# Patient Record
Sex: Female | Born: 1980 | Race: Black or African American | Hispanic: No | Marital: Married | Smoking: Never smoker
Health system: Southern US, Community
[De-identification: ages and names within clinical notes are randomized; demographics above are authoritative.]

## PROBLEM LIST (undated history)

## (undated) DIAGNOSIS — F32A Depression, unspecified: Secondary | ICD-10-CM

## (undated) DIAGNOSIS — F329 Major depressive disorder, single episode, unspecified: Secondary | ICD-10-CM

## (undated) DIAGNOSIS — I10 Essential (primary) hypertension: Secondary | ICD-10-CM

## (undated) DIAGNOSIS — F419 Anxiety disorder, unspecified: Secondary | ICD-10-CM

## (undated) DIAGNOSIS — E119 Type 2 diabetes mellitus without complications: Secondary | ICD-10-CM

## (undated) DIAGNOSIS — G4730 Sleep apnea, unspecified: Secondary | ICD-10-CM

## (undated) DIAGNOSIS — K219 Gastro-esophageal reflux disease without esophagitis: Secondary | ICD-10-CM

## (undated) DIAGNOSIS — D649 Anemia, unspecified: Secondary | ICD-10-CM

## (undated) HISTORY — PX: OTHER SURGICAL HISTORY: SHX169

## (undated) HISTORY — PX: ENDOMETRIAL ABLATION: SHX621

---

## 1998-07-30 ENCOUNTER — Emergency Department (HOSPITAL_COMMUNITY): Admission: EM | Admit: 1998-07-30 | Discharge: 1998-07-30 | Payer: Self-pay | Admitting: Emergency Medicine

## 1998-07-30 ENCOUNTER — Inpatient Hospital Stay (HOSPITAL_COMMUNITY): Admission: AD | Admit: 1998-07-30 | Discharge: 1998-07-30 | Payer: Self-pay | Admitting: Obstetrics

## 1998-07-30 ENCOUNTER — Encounter: Payer: Self-pay | Admitting: Emergency Medicine

## 1998-09-19 ENCOUNTER — Emergency Department (HOSPITAL_COMMUNITY): Admission: EM | Admit: 1998-09-19 | Discharge: 1998-09-19 | Payer: Self-pay | Admitting: Emergency Medicine

## 1999-07-01 ENCOUNTER — Encounter: Payer: Self-pay | Admitting: *Deleted

## 1999-07-01 ENCOUNTER — Emergency Department (HOSPITAL_COMMUNITY): Admission: EM | Admit: 1999-07-01 | Discharge: 1999-07-01 | Payer: Self-pay | Admitting: *Deleted

## 1999-11-06 ENCOUNTER — Emergency Department (HOSPITAL_COMMUNITY): Admission: EM | Admit: 1999-11-06 | Discharge: 1999-11-06 | Payer: Self-pay | Admitting: Emergency Medicine

## 2000-02-14 ENCOUNTER — Emergency Department (HOSPITAL_COMMUNITY): Admission: EM | Admit: 2000-02-14 | Discharge: 2000-02-14 | Payer: Self-pay | Admitting: Emergency Medicine

## 2000-04-23 ENCOUNTER — Emergency Department (HOSPITAL_COMMUNITY): Admission: EM | Admit: 2000-04-23 | Discharge: 2000-04-23 | Payer: Self-pay | Admitting: Emergency Medicine

## 2000-04-27 ENCOUNTER — Emergency Department (HOSPITAL_COMMUNITY): Admission: EM | Admit: 2000-04-27 | Discharge: 2000-04-27 | Payer: Self-pay | Admitting: Emergency Medicine

## 2000-07-16 ENCOUNTER — Emergency Department (HOSPITAL_COMMUNITY): Admission: EM | Admit: 2000-07-16 | Discharge: 2000-07-16 | Payer: Self-pay | Admitting: Emergency Medicine

## 2000-07-16 ENCOUNTER — Encounter: Payer: Self-pay | Admitting: Emergency Medicine

## 2000-07-23 ENCOUNTER — Emergency Department (HOSPITAL_COMMUNITY): Admission: EM | Admit: 2000-07-23 | Discharge: 2000-07-24 | Payer: Self-pay | Admitting: Emergency Medicine

## 2001-03-29 ENCOUNTER — Emergency Department (HOSPITAL_COMMUNITY): Admission: EM | Admit: 2001-03-29 | Discharge: 2001-03-29 | Payer: Self-pay | Admitting: Emergency Medicine

## 2001-04-12 ENCOUNTER — Other Ambulatory Visit: Admission: RE | Admit: 2001-04-12 | Discharge: 2001-04-12 | Payer: Self-pay | Admitting: Obstetrics and Gynecology

## 2001-05-09 ENCOUNTER — Emergency Department (HOSPITAL_COMMUNITY): Admission: EM | Admit: 2001-05-09 | Discharge: 2001-05-10 | Payer: Self-pay | Admitting: Emergency Medicine

## 2001-07-13 ENCOUNTER — Emergency Department (HOSPITAL_COMMUNITY): Admission: EM | Admit: 2001-07-13 | Discharge: 2001-07-13 | Payer: Self-pay | Admitting: Emergency Medicine

## 2003-02-14 ENCOUNTER — Encounter: Payer: Self-pay | Admitting: Emergency Medicine

## 2003-02-14 ENCOUNTER — Emergency Department (HOSPITAL_COMMUNITY): Admission: EM | Admit: 2003-02-14 | Discharge: 2003-02-14 | Payer: Self-pay | Admitting: Emergency Medicine

## 2003-02-15 ENCOUNTER — Encounter: Payer: Self-pay | Admitting: Emergency Medicine

## 2003-11-03 ENCOUNTER — Other Ambulatory Visit: Admission: RE | Admit: 2003-11-03 | Discharge: 2003-11-03 | Payer: Self-pay | Admitting: Obstetrics and Gynecology

## 2003-12-29 ENCOUNTER — Emergency Department (HOSPITAL_COMMUNITY): Admission: EM | Admit: 2003-12-29 | Discharge: 2003-12-30 | Payer: Self-pay | Admitting: Emergency Medicine

## 2004-01-08 ENCOUNTER — Other Ambulatory Visit: Admission: RE | Admit: 2004-01-08 | Discharge: 2004-01-08 | Payer: Self-pay | Admitting: Obstetrics and Gynecology

## 2004-05-14 ENCOUNTER — Emergency Department (HOSPITAL_COMMUNITY): Admission: EM | Admit: 2004-05-14 | Discharge: 2004-05-14 | Payer: Self-pay | Admitting: Family Medicine

## 2004-06-18 ENCOUNTER — Other Ambulatory Visit: Admission: RE | Admit: 2004-06-18 | Discharge: 2004-06-18 | Payer: Self-pay | Admitting: Obstetrics and Gynecology

## 2004-06-22 ENCOUNTER — Ambulatory Visit (HOSPITAL_COMMUNITY): Admission: RE | Admit: 2004-06-22 | Discharge: 2004-06-22 | Payer: Self-pay | Admitting: Obstetrics and Gynecology

## 2004-08-12 ENCOUNTER — Ambulatory Visit: Payer: Self-pay | Admitting: Family Medicine

## 2004-08-13 ENCOUNTER — Ambulatory Visit: Payer: Self-pay | Admitting: Obstetrics & Gynecology

## 2004-08-26 ENCOUNTER — Ambulatory Visit: Payer: Self-pay | Admitting: Family Medicine

## 2004-09-08 ENCOUNTER — Ambulatory Visit: Payer: Self-pay | Admitting: Obstetrics & Gynecology

## 2004-09-15 ENCOUNTER — Ambulatory Visit: Payer: Self-pay | Admitting: Obstetrics & Gynecology

## 2004-09-21 ENCOUNTER — Ambulatory Visit (HOSPITAL_COMMUNITY): Admission: RE | Admit: 2004-09-21 | Discharge: 2004-09-21 | Payer: Self-pay | Admitting: *Deleted

## 2004-09-22 ENCOUNTER — Ambulatory Visit: Payer: Self-pay | Admitting: Obstetrics & Gynecology

## 2004-09-29 ENCOUNTER — Ambulatory Visit: Payer: Self-pay | Admitting: *Deleted

## 2004-10-16 ENCOUNTER — Inpatient Hospital Stay (HOSPITAL_COMMUNITY): Admission: AD | Admit: 2004-10-16 | Discharge: 2004-10-16 | Payer: Self-pay | Admitting: Family Medicine

## 2004-10-19 ENCOUNTER — Ambulatory Visit: Payer: Self-pay | Admitting: Obstetrics & Gynecology

## 2004-10-20 ENCOUNTER — Ambulatory Visit: Payer: Self-pay | Admitting: *Deleted

## 2004-10-21 ENCOUNTER — Ambulatory Visit: Payer: Self-pay | Admitting: Internal Medicine

## 2004-10-26 ENCOUNTER — Ambulatory Visit: Payer: Self-pay

## 2004-10-27 ENCOUNTER — Ambulatory Visit: Payer: Self-pay | Admitting: Obstetrics & Gynecology

## 2004-10-28 ENCOUNTER — Ambulatory Visit: Payer: Self-pay | Admitting: Internal Medicine

## 2004-10-29 ENCOUNTER — Encounter: Payer: Self-pay | Admitting: Cardiology

## 2004-10-29 ENCOUNTER — Ambulatory Visit: Payer: Self-pay | Admitting: Cardiology

## 2004-10-29 ENCOUNTER — Ambulatory Visit (HOSPITAL_COMMUNITY): Admission: RE | Admit: 2004-10-29 | Discharge: 2004-10-29 | Payer: Self-pay | Admitting: Internal Medicine

## 2004-11-10 ENCOUNTER — Ambulatory Visit: Payer: Self-pay | Admitting: *Deleted

## 2004-11-17 ENCOUNTER — Ambulatory Visit: Payer: Self-pay | Admitting: *Deleted

## 2004-11-18 ENCOUNTER — Ambulatory Visit (HOSPITAL_COMMUNITY): Admission: RE | Admit: 2004-11-18 | Discharge: 2004-11-18 | Payer: Self-pay | Admitting: *Deleted

## 2004-11-24 ENCOUNTER — Ambulatory Visit: Payer: Self-pay | Admitting: Obstetrics & Gynecology

## 2004-12-08 ENCOUNTER — Ambulatory Visit: Payer: Self-pay | Admitting: *Deleted

## 2004-12-13 ENCOUNTER — Ambulatory Visit: Payer: Self-pay | Admitting: Obstetrics and Gynecology

## 2004-12-20 ENCOUNTER — Ambulatory Visit: Payer: Self-pay | Admitting: Obstetrics & Gynecology

## 2004-12-22 ENCOUNTER — Ambulatory Visit (HOSPITAL_COMMUNITY): Admission: RE | Admit: 2004-12-22 | Discharge: 2004-12-22 | Payer: Self-pay | Admitting: *Deleted

## 2004-12-22 ENCOUNTER — Ambulatory Visit: Payer: Self-pay | Admitting: *Deleted

## 2004-12-27 ENCOUNTER — Ambulatory Visit: Payer: Self-pay | Admitting: Obstetrics & Gynecology

## 2004-12-29 ENCOUNTER — Ambulatory Visit (HOSPITAL_COMMUNITY): Admission: RE | Admit: 2004-12-29 | Discharge: 2004-12-29 | Payer: Self-pay | Admitting: *Deleted

## 2004-12-29 ENCOUNTER — Ambulatory Visit: Payer: Self-pay | Admitting: *Deleted

## 2005-01-03 ENCOUNTER — Ambulatory Visit: Payer: Self-pay | Admitting: Family Medicine

## 2005-01-05 ENCOUNTER — Ambulatory Visit (HOSPITAL_COMMUNITY): Admission: RE | Admit: 2005-01-05 | Discharge: 2005-01-05 | Payer: Self-pay | Admitting: *Deleted

## 2005-01-05 ENCOUNTER — Ambulatory Visit: Payer: Self-pay | Admitting: *Deleted

## 2005-01-11 ENCOUNTER — Ambulatory Visit: Payer: Self-pay | Admitting: *Deleted

## 2005-01-13 ENCOUNTER — Ambulatory Visit (HOSPITAL_COMMUNITY): Admission: RE | Admit: 2005-01-13 | Discharge: 2005-01-13 | Payer: Self-pay | Admitting: *Deleted

## 2005-01-13 ENCOUNTER — Ambulatory Visit: Payer: Self-pay | Admitting: Family Medicine

## 2005-01-17 ENCOUNTER — Ambulatory Visit: Payer: Self-pay | Admitting: *Deleted

## 2005-01-19 ENCOUNTER — Ambulatory Visit: Payer: Self-pay | Admitting: *Deleted

## 2005-01-19 ENCOUNTER — Ambulatory Visit: Payer: Self-pay | Admitting: Obstetrics & Gynecology

## 2005-01-19 ENCOUNTER — Inpatient Hospital Stay (HOSPITAL_COMMUNITY): Admission: AD | Admit: 2005-01-19 | Discharge: 2005-01-22 | Payer: Self-pay | Admitting: Obstetrics and Gynecology

## 2005-01-19 ENCOUNTER — Inpatient Hospital Stay (HOSPITAL_COMMUNITY): Admission: RE | Admit: 2005-01-19 | Discharge: 2005-01-19 | Payer: Self-pay | Admitting: *Deleted

## 2005-02-01 ENCOUNTER — Inpatient Hospital Stay (HOSPITAL_COMMUNITY): Admission: AD | Admit: 2005-02-01 | Discharge: 2005-02-01 | Payer: Self-pay | Admitting: *Deleted

## 2005-02-02 ENCOUNTER — Inpatient Hospital Stay (HOSPITAL_COMMUNITY): Admission: AD | Admit: 2005-02-02 | Discharge: 2005-02-02 | Payer: Self-pay | Admitting: Obstetrics and Gynecology

## 2005-02-03 ENCOUNTER — Ambulatory Visit: Payer: Self-pay | Admitting: Obstetrics & Gynecology

## 2005-02-03 ENCOUNTER — Inpatient Hospital Stay (HOSPITAL_COMMUNITY): Admission: AD | Admit: 2005-02-03 | Discharge: 2005-02-06 | Payer: Self-pay | Admitting: *Deleted

## 2005-02-03 ENCOUNTER — Ambulatory Visit: Payer: Self-pay | Admitting: Certified Nurse Midwife

## 2005-02-09 ENCOUNTER — Inpatient Hospital Stay (HOSPITAL_COMMUNITY): Admission: AD | Admit: 2005-02-09 | Discharge: 2005-02-09 | Payer: Self-pay | Admitting: Obstetrics and Gynecology

## 2005-02-17 ENCOUNTER — Emergency Department (HOSPITAL_COMMUNITY): Admission: EM | Admit: 2005-02-17 | Discharge: 2005-02-17 | Payer: Self-pay | Admitting: Emergency Medicine

## 2005-02-23 ENCOUNTER — Emergency Department (HOSPITAL_COMMUNITY): Admission: EM | Admit: 2005-02-23 | Discharge: 2005-02-23 | Payer: Self-pay | Admitting: Family Medicine

## 2005-04-26 ENCOUNTER — Encounter: Admission: RE | Admit: 2005-04-26 | Discharge: 2005-04-26 | Payer: Self-pay | Admitting: General Surgery

## 2005-05-05 IMAGING — US US OB FOLLOW-UP
1 series · 13 of 28 positions shown · non-contrast
Comparison: none

CLINICAL DATA: Assess growth.

[Series 1: us ob follow-up · 0.41mm/px · 13 of 37 slices shown]
[im 2/37]
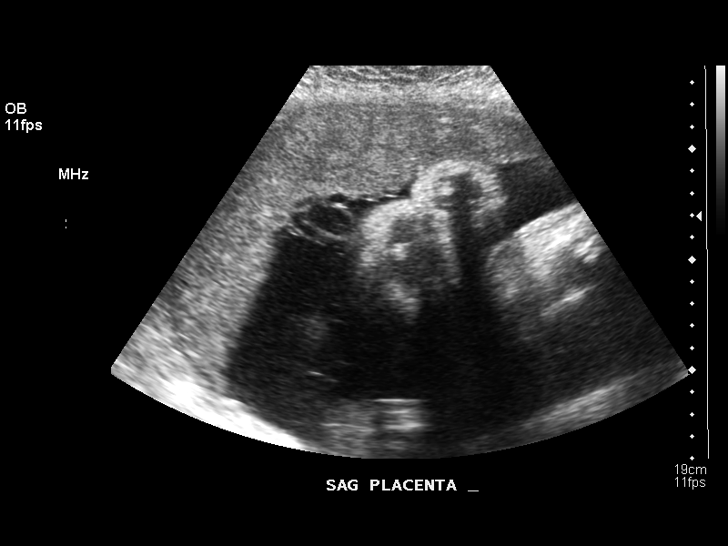
[im 5/37]
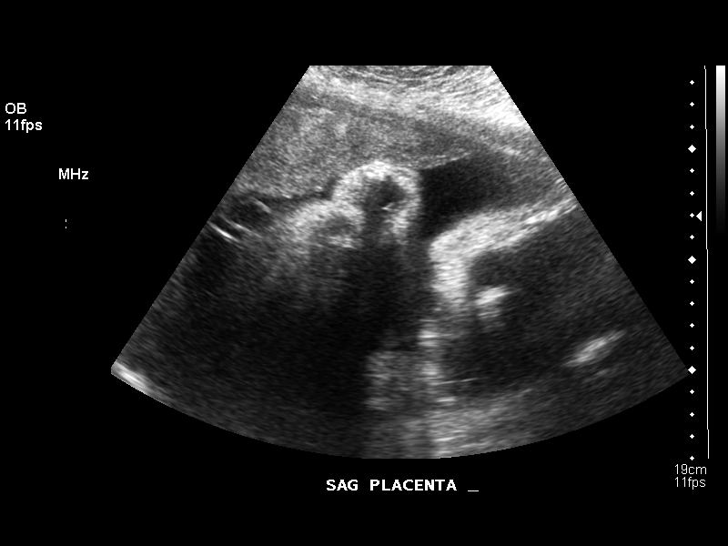
[im 7/37]
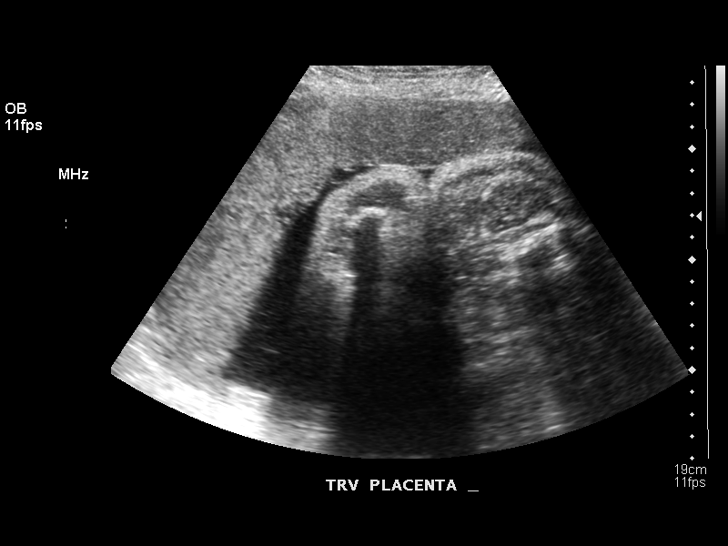
[im 10/37]
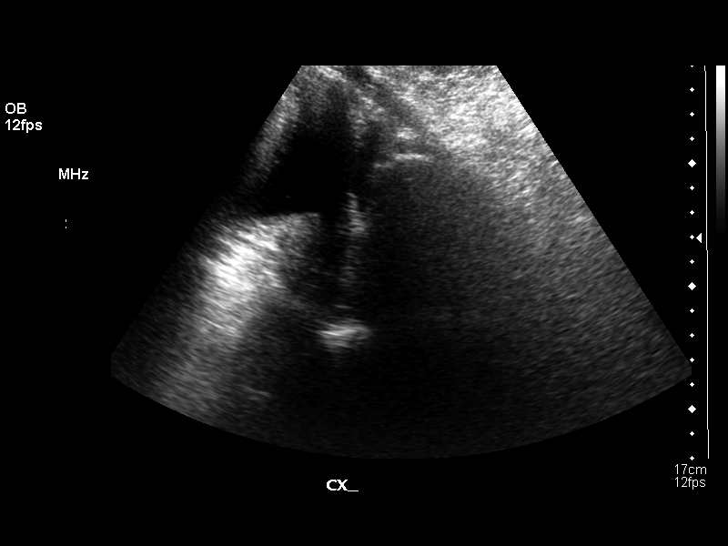
[im 13/37]
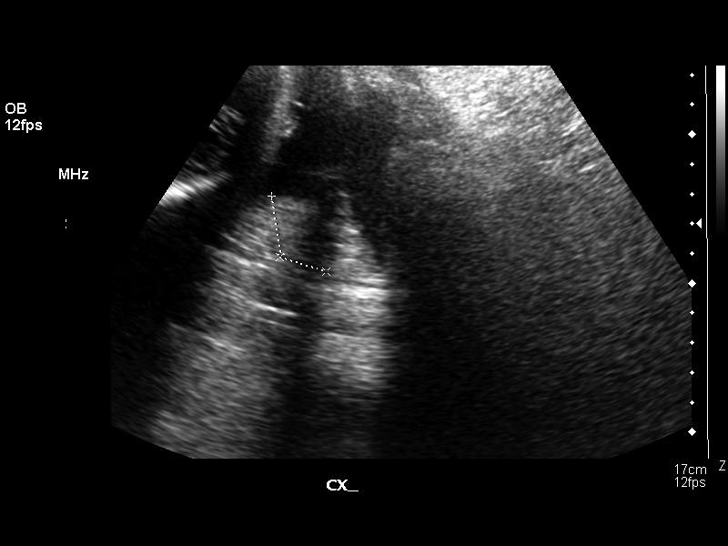
[im 15/37]
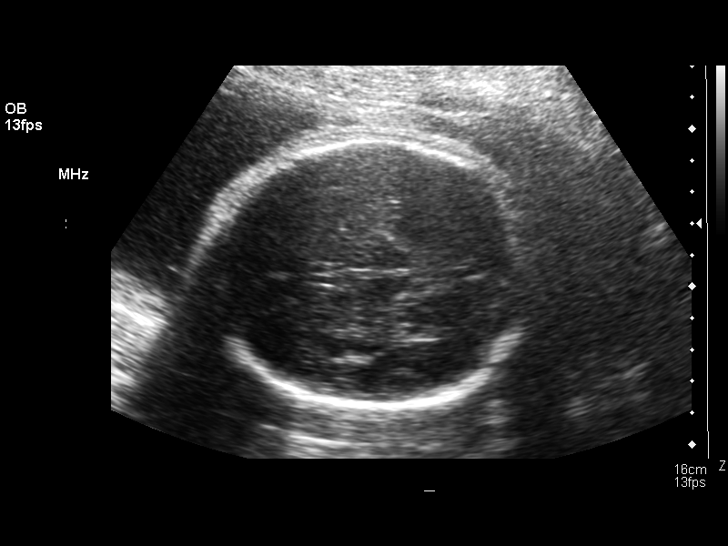
[im 19/37]
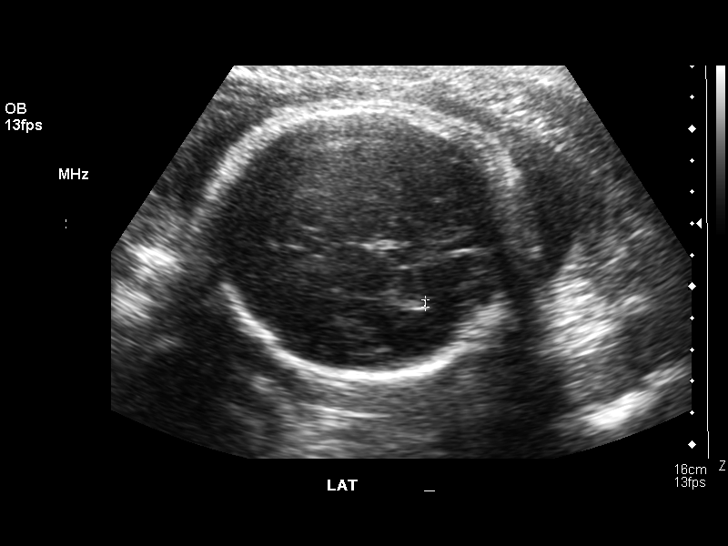
[im 22/37]
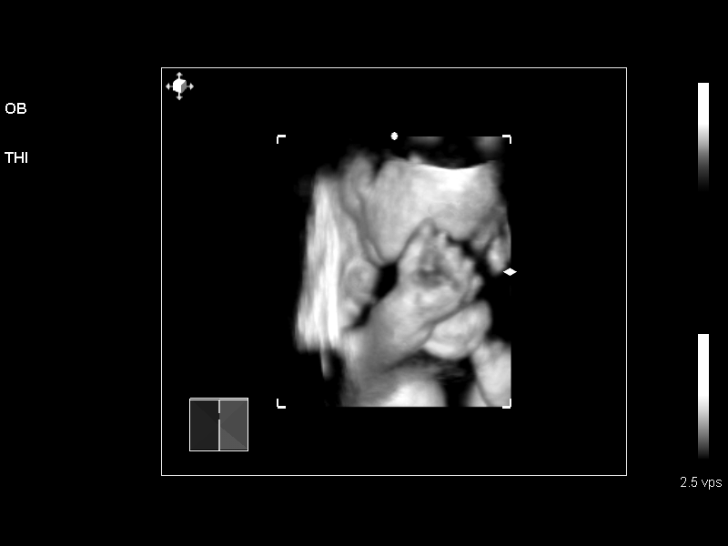
[im 25/37]
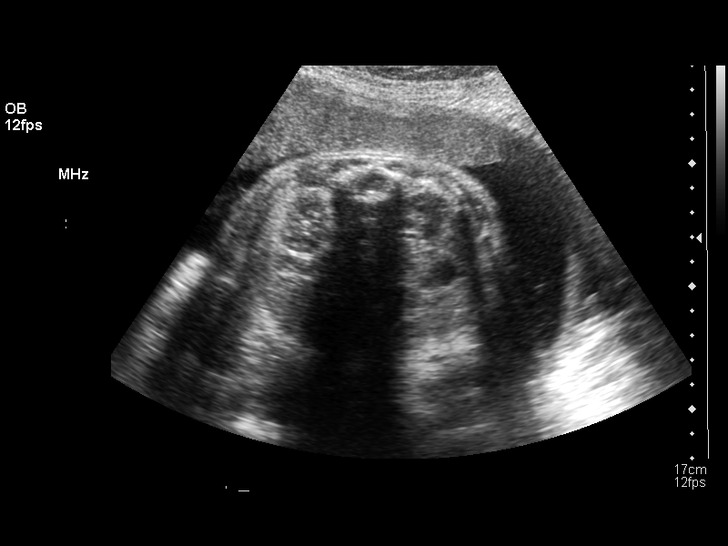
[im 27/37]
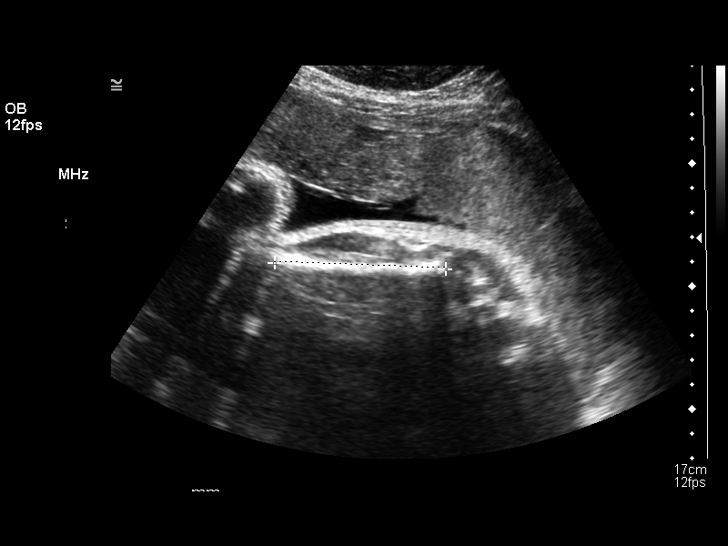
[im 30/37]
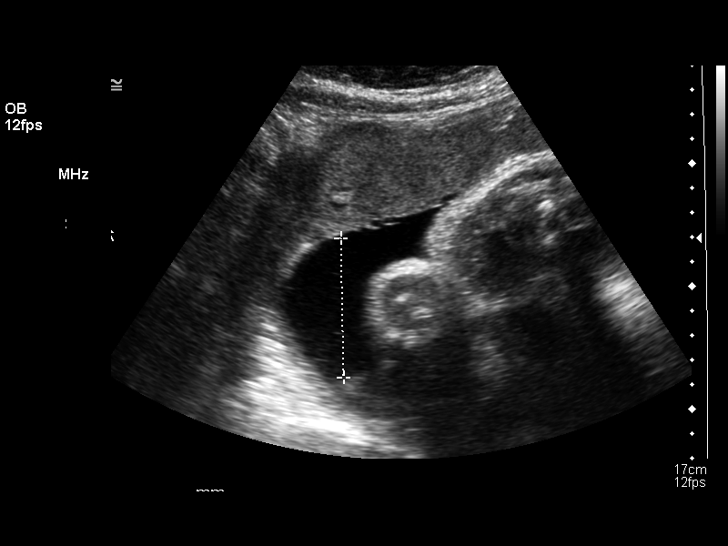
[im 33/37]
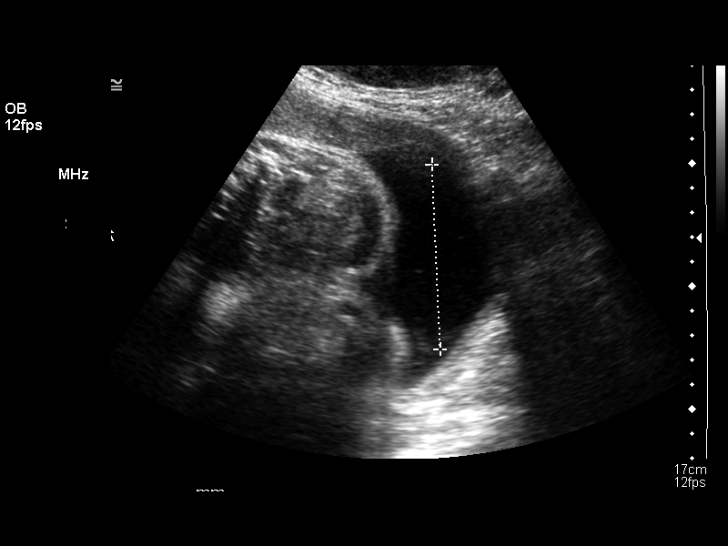
[im 35/37]
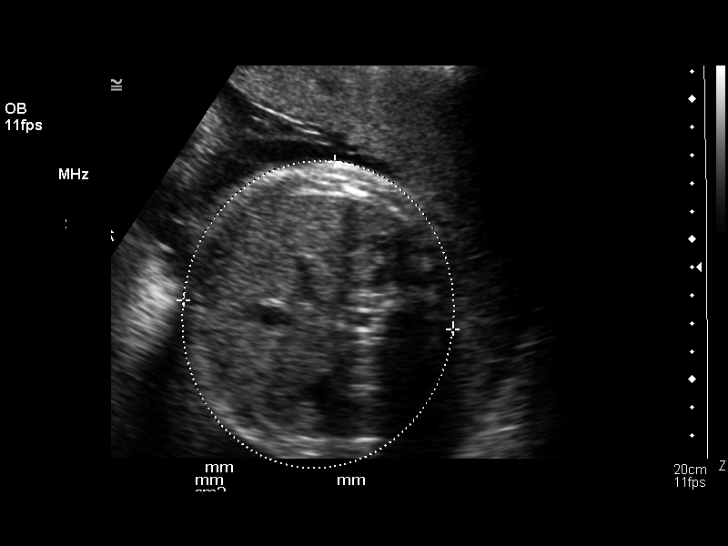

[13 of 28 positions shown; findings below may reference images not displayed]

OBSTETRICAL ULTRASOUND RE-EVALUATION:
Number of Fetuses:  1
Heart Rate:  131
Movement:  Yes
Breathing:  Yes
Presentation:  Cephalic
Placental Location:  Anterior
Grade:  I
Previa:  No
Amniotic Fluid (subjective):  Normal
Amniotic Fluid (objective):  20.1 cm AFI (5th -95th%ile = 8.3 ? 24.5 cm for 33 wks)

FETAL BIOMETRY
BPD:  8.5 cm   34 w 0 d
HC:  31.8 cm   35 w 6 d
AC:  33.6 cm   37 w 4 d
FL:  7.1 cm   36 w 1 d

Mean GA:  35 w 6 d
Assigned GA:  33 w 3 d
BPD/OFD: .78 (0.70 ? 0.86); FL/BPD: .83 (0.71 ? 0.87); FL/AC: .21 (0.20 ? 0.24); HC/AC: .95 (0.96 ? 1.11)
EFW:  2588 g (H) Greater than 95th%ile (95th = 3693 g) For 33 wks

FETAL ANATOMY
Lateral Ventricles:  Visualized 
Thalami/CSP:  Visualized 
Posterior Fossa:  Previously seen 
Nuchal Region:  Previously seen 
Spine:  Previously seen 
4 Chamber Heart on Left:  Previously seen 
Stomach on Left:  Visualized 
3 Vessel Cord:  Visualized 
Cord Insertion Site:  Previously seen 
Kidneys:  Visualized   
Bladder:  Visualized 
Extremities:    Previously seen 

ADDITIONAL ANATOMY VISUALIZED:  Male genitalia

MATERNAL UTERINE AND ADNEXAL FINDINGS
Cervix:  3.7 cm Transabdominally
IMPRESSION: 1.  Single intrauterine pregnancy demonstrating an estimated gestational age by ultrasound of 35 weeks and 6 days.  This is 2 weeks and 3 days ahead of assigned gestational age of 33 weeks and 3 days.  Currently the estimated fetal weight is 2588 gram and this is greater than the 95th percentile for a 33 week gestation.  This suggests the presence of a large-for-gestational-age fetus and continued close follow-up for growth is recommended.
2.  Subjectively and quantitatively normal amniotic fluid volume and normal cervical length.
3.  No late developing fetal anatomic abnormalities are identified associated with the lateral ventricles, stomach, kidneys or bladder.  A four chamber heart view could not be reassessed due to positioning on today?s exam.

## 2005-08-05 ENCOUNTER — Inpatient Hospital Stay (HOSPITAL_COMMUNITY): Admission: AD | Admit: 2005-08-05 | Discharge: 2005-08-05 | Payer: Self-pay | Admitting: Family Medicine

## 2005-11-20 ENCOUNTER — Emergency Department (HOSPITAL_COMMUNITY): Admission: EM | Admit: 2005-11-20 | Discharge: 2005-11-20 | Payer: Self-pay | Admitting: Family Medicine

## 2006-01-13 ENCOUNTER — Ambulatory Visit (HOSPITAL_BASED_OUTPATIENT_CLINIC_OR_DEPARTMENT_OTHER): Admission: RE | Admit: 2006-01-13 | Discharge: 2006-01-13 | Payer: Self-pay | Admitting: Family Medicine

## 2006-01-29 ENCOUNTER — Ambulatory Visit: Payer: Self-pay | Admitting: Internal Medicine

## 2006-05-08 ENCOUNTER — Encounter: Admission: RE | Admit: 2006-05-08 | Discharge: 2006-05-08 | Payer: Self-pay | Admitting: Obstetrics and Gynecology

## 2006-05-27 ENCOUNTER — Inpatient Hospital Stay (HOSPITAL_COMMUNITY): Admission: AD | Admit: 2006-05-27 | Discharge: 2006-05-27 | Payer: Self-pay | Admitting: Obstetrics and Gynecology

## 2006-09-13 ENCOUNTER — Inpatient Hospital Stay (HOSPITAL_COMMUNITY): Admission: AD | Admit: 2006-09-13 | Discharge: 2006-09-18 | Payer: Self-pay | Admitting: Obstetrics and Gynecology

## 2006-09-14 ENCOUNTER — Encounter (INDEPENDENT_AMBULATORY_CARE_PROVIDER_SITE_OTHER): Payer: Self-pay | Admitting: Specialist

## 2006-11-13 ENCOUNTER — Emergency Department (HOSPITAL_COMMUNITY): Admission: EM | Admit: 2006-11-13 | Discharge: 2006-11-13 | Payer: Self-pay | Admitting: Family Medicine

## 2007-05-24 ENCOUNTER — Emergency Department (HOSPITAL_COMMUNITY): Admission: EM | Admit: 2007-05-24 | Discharge: 2007-05-24 | Payer: Self-pay | Admitting: Emergency Medicine

## 2007-11-09 ENCOUNTER — Emergency Department (HOSPITAL_COMMUNITY): Admission: EM | Admit: 2007-11-09 | Discharge: 2007-11-09 | Payer: Self-pay | Admitting: Emergency Medicine

## 2007-12-22 ENCOUNTER — Emergency Department (HOSPITAL_COMMUNITY): Admission: EM | Admit: 2007-12-22 | Discharge: 2007-12-22 | Payer: Self-pay | Admitting: Family Medicine

## 2008-09-04 ENCOUNTER — Emergency Department (HOSPITAL_COMMUNITY): Admission: EM | Admit: 2008-09-04 | Discharge: 2008-09-04 | Payer: Self-pay | Admitting: Family Medicine

## 2010-04-15 ENCOUNTER — Ambulatory Visit (HOSPITAL_BASED_OUTPATIENT_CLINIC_OR_DEPARTMENT_OTHER): Admission: RE | Admit: 2010-04-15 | Discharge: 2010-04-15 | Payer: Self-pay | Admitting: Orthopedic Surgery

## 2010-06-03 ENCOUNTER — Ambulatory Visit (HOSPITAL_BASED_OUTPATIENT_CLINIC_OR_DEPARTMENT_OTHER): Admission: RE | Admit: 2010-06-03 | Discharge: 2010-06-03 | Payer: Self-pay | Admitting: Orthopedic Surgery

## 2010-08-26 ENCOUNTER — Ambulatory Visit (HOSPITAL_COMMUNITY): Payer: Self-pay | Admitting: Psychiatry

## 2010-09-22 ENCOUNTER — Ambulatory Visit (HOSPITAL_COMMUNITY): Payer: Self-pay | Admitting: Psychiatry

## 2010-10-14 ENCOUNTER — Ambulatory Visit (HOSPITAL_COMMUNITY): Payer: Self-pay | Admitting: Psychiatry

## 2010-11-04 ENCOUNTER — Ambulatory Visit (HOSPITAL_COMMUNITY)
Admission: RE | Admit: 2010-11-04 | Discharge: 2010-11-04 | Payer: Self-pay | Source: Home / Self Care | Attending: Physician Assistant | Admitting: Physician Assistant

## 2010-11-07 ENCOUNTER — Encounter: Payer: Self-pay | Admitting: *Deleted

## 2010-11-07 ENCOUNTER — Encounter: Payer: Self-pay | Admitting: Obstetrics and Gynecology

## 2010-12-02 ENCOUNTER — Encounter (HOSPITAL_COMMUNITY): Payer: Self-pay | Admitting: Physician Assistant

## 2010-12-07 ENCOUNTER — Encounter (HOSPITAL_COMMUNITY): Payer: BC Managed Care – PPO | Admitting: Physician Assistant

## 2010-12-07 DIAGNOSIS — F988 Other specified behavioral and emotional disorders with onset usually occurring in childhood and adolescence: Secondary | ICD-10-CM

## 2010-12-31 LAB — BASIC METABOLIC PANEL
BUN: 9 mg/dL (ref 6–23)
Creatinine, Ser: 0.78 mg/dL (ref 0.4–1.2)
GFR calc non Af Amer: >60 mL/min (ref >60)
Potassium: 3.1 mEq/L — ABNORMAL LOW (ref 3.5–5.1)

## 2010-12-31 LAB — POCT HEMOGLOBIN-HEMACUE: Hemoglobin: 11.6 g/dL — ABNORMAL LOW (ref 12.0–15.0)

## 2011-01-03 LAB — BASIC METABOLIC PANEL
BUN: 10 mg/dL (ref 6–23)
Creatinine, Ser: 0.73 mg/dL (ref 0.4–1.2)
GFR calc non Af Amer: >60 mL/min (ref >60)

## 2011-01-03 LAB — POCT HEMOGLOBIN-HEMACUE: Hemoglobin: 10.5 g/dL — ABNORMAL LOW (ref 12.0–15.0)

## 2011-01-13 ENCOUNTER — Emergency Department (HOSPITAL_COMMUNITY): Payer: BC Managed Care – PPO

## 2011-01-13 ENCOUNTER — Emergency Department (HOSPITAL_COMMUNITY)
Admission: EM | Admit: 2011-01-13 | Discharge: 2011-01-13 | Disposition: A | Discharge status: Home / Self Care | Payer: BC Managed Care – PPO | Attending: Emergency Medicine | Admitting: Emergency Medicine

## 2011-01-13 DIAGNOSIS — J111 Influenza due to unidentified influenza virus with other respiratory manifestations: Secondary | ICD-10-CM | POA: Insufficient documentation

## 2011-01-13 DIAGNOSIS — R0602 Shortness of breath: Secondary | ICD-10-CM | POA: Insufficient documentation

## 2011-01-13 DIAGNOSIS — J069 Acute upper respiratory infection, unspecified: Secondary | ICD-10-CM | POA: Insufficient documentation

## 2011-01-13 DIAGNOSIS — R509 Fever, unspecified: Secondary | ICD-10-CM | POA: Insufficient documentation

## 2011-01-13 LAB — POCT I-STAT, CHEM 8
Calcium, Ion: 1.08 mmol/L — ABNORMAL LOW (ref 1.12–1.32)
HCT: 35 % — ABNORMAL LOW (ref 36.0–46.0)
TCO2: 24 mmol/L (ref 0–100)

## 2011-01-14 LAB — STREP A DNA PROBE

## 2011-01-20 ENCOUNTER — Encounter (HOSPITAL_COMMUNITY): Payer: BC Managed Care – PPO | Admitting: Physician Assistant

## 2011-02-16 ENCOUNTER — Encounter (HOSPITAL_COMMUNITY): Payer: BC Managed Care – PPO | Admitting: Physician Assistant

## 2011-03-04 NOTE — Op Note (Signed)
[name], [name]              ACCOUNT NO.:  [id number]   MEDICAL RECORD NO.:  [id number]          PATIENT TYPE:  INP   LOCATION:  9109                          FACILITY:  [facility]   PHYSICIAN:  Sheronette Cousins, M.D.DATE OF BIRTH:  Oct 31, 1980   DATE OF PROCEDURE:  09/14/2006  DATE OF DISCHARGE:                               OPERATIVE REPORT   PREOPERATIVE DIAGNOSIS:  Arrest of dilatation, chronic hypertension,  class A2 gestational diabetes, intrauterine gestation at 50 plus weeks.   PROCEDURE:  Primary cesarean section.   POSTOPERATIVE DIAGNOSIS:  Arrest of dilatation, fetal macrosomia,  chronic hypertension, class A2 gestational diabetes, intrauterine  gestation at 37 weeks.   ANESTHESIA:  Epidural.   SURGEON:  Maxie Better, M.D.   ASSISTANT:  Marlinda Mike, C.N.M.   PROCEDURE:  Under adequate epidural anesthesia, the patient was placed  in the supine position with a left lateral tilt.  An indwelling Foley  catheter was already in place.  The patient was sterilely prepped and  draped in usual fashion.  A Pfannenstiel skin incision was then made,  carried down to the rectus fascia.  The rectus fascia was opened in the  midline and extended bilaterally.  Rectus fascia was then bluntly and  sharply dissected off the rectus muscles in superior and inferior  fashion.  Rectus muscles split in midline.  The parietal peritoneum was  entered bluntly and extended.  The lower uterine segment was noted to be  not developed.  Transverse incision was then made and extended  bilaterally using bandage scissors.  Subsequent delivery of a live female  was accomplished.  Loops of cord had initially come up in the field and  was difficult to decompress.  The baby subsequently was delivered using  vacuum extraction.  The cord was clamped, cut, the baby was transferred  to the awaiting pediatrician who assigned Apgars of 5 and 8 at one and  five minutes.  Placenta was manually removed.   Uterine cavity was  cleaned of debris.  Uterine incision was thickened, closed with 0  Monocryl running locked stitch in first layer, second layer was  imbricated using 0 Monocryl suture.  There were some fibroids posterior,  in subserosal location.  Both ovaries were polycystic in nature.  Both  tubes were normal.  Several figure-of-eights were placed along the  incision line for good hemostasis.  The abdomen was then copiously  irrigated and suctioned of debris.  The parietal peritoneum and the  vesicouterine peritoneum were not closed.  Cord pH had been obtained  which was 7.12.  The rectus fascia was closed with 0 Vicryl x2.  The the  subcutaneous area was irrigated and small bleeders cauterized.  Interrupted 2-0 plain sutures were cleaned.  The skin was approximated  using Ethicon staples.   SPECIMEN:  Placenta, not sent to pathology.   ESTIMATED BLOOD LOSS:  800 mL.   INTRAOPERATIVE FLUID:  3000 mL crystalloid.   URINE OUTPUT:  20 mL clear yellow urine.   Sponge and instrument counts x2 was correct.   COMPLICATIONS:  None.   Weight of the baby was 8  pounds 9 ounces.  The patient was transferred  to the recovery room in stable condition.      Maxie Better, M.D.  Electronically Signed     [place]/MEDQ  D:  09/14/2006  T:  09/15/2006  Job:  [id number]

## 2011-03-04 NOTE — Discharge Summary (Signed)
[name], [name]              ACCOUNT NO.:  [id number]   MEDICAL RECORD NO.:  [id number]          PATIENT TYPE:  INP   LOCATION:  9109                          [facility]   PHYSICIAN:  Maxie Better, M.D.DATE OF BIRTH:  [birth year]   DATE OF ADMISSION:  09/13/2006  DATE OF DISCHARGE:  09/18/2006                               DISCHARGE SUMMARY   ADMISSION DIAGNOSES:  1. Class A2 gestational diabetes with mature lung indices.  2. Chronic hypertension, question superimposed preeclampsia.  3. Intrauterine gestation at 37+ weeks.  4. Previous shoulder dystocia.  5. Iron-deficiency anemia.   DISCHARGE DIAGNOSES:  1. Intrauterine gestation at 37 weeks, delivered.  2. Class A2 gestational diabetes with mature lung indices.  3. Chronic hypertension.  4. Iron-deficiency anemia.  5. Status post primary cesarean section, arrest of dilatation.   PROCEDURES:  Primary cesarean section.   HISTORY OF PRESENT ILLNESS:  A 30 year old G41, P2, 0-1-2 married black  female at 37+ weeks with class A2 gestational diabetes and chronic  hypertension  with superimposed preeclampsia and documented mature lung  indices on amniocentesis, who presents for induction of labor.   HOSPITAL COURSE:  Patient was admitted to [facility].  She had PIH  labs, which were notable for uric acid of 6.  On admission, the patient  was 50-60% effaced, -3 vertex presentation.  Her blood pressure was  126/90.  The patient has been on labetalol.  The patient was started on  Pitocin induction.  She subsequently had artificial rupture of  membranes, copious clear fluid,internal scalp electrode and intrauterine  pressure catheter were placed.  Magnesium sulfate was deferred due to  the otherwise normal labs.  Labetalol was continued.  The patient's  blood sugars were monitored throughout her labor.  The patient during  her labor required continued intermittent IV labetalol, elevated blood  pressures.  Estimated  fetal weight on ultrasound had been 8 pounds, 8  ounces on the day of admission.  The patient's previous shoulder  dystocia was with a 9+ pound baby.   The patient progressed to 4 cm and arrested at that dilatation with  adequate contractions for several hours.  The decision was made to  proceed to primary cesarean section.  Please see the dictated note for  the C-section.  The procedure resulted in delivery of a live 8 pound, 9  ounce female infant, Apgars 5 and 8.   Postoperatively, the patient had a hemoglobin of 8.8, a hematocrit of  26.6, white count 12, platelet count 197,000. She was asymptomatic.  It  showed temperature maximum of 101.  Her labetalol was continued.  Her  blood pressures postoperatively ranged between the 140s/80s to 150s/90s.   By postop day #4, the patient was deemed well enough to be discharged  home.   DISPOSITION:  Home.   CONDITION:  Stable.   DISCHARGE MEDICATIONS:  1. Percocet 1-2 tablets q.4-6h. p.r.n. pain.  2. Labetalol 100 mg p.o. b.i.d.  3. Iron supplementation 325 mg p.o. b.i.d.  4. Continue her prenatal vitamins at 1 p.o. daily.   DISCHARGE INSTRUCTIONS:  Per the postpartum booklet  given, including the  PIH warning signs.   FOLLOW-UP APPOINTMENTS:  [facility] in one week for a blood  pressure check and six weeks postpartum, eight weeks two hour glucose  tolerance test.      Maxie Better, M.D.  Electronically Signed     [place]/MEDQ  D:  10/07/2006  T:  10/07/2006  Job:  [id number]

## 2011-03-04 NOTE — Procedures (Signed)
NAMEHILLIARY, [name]              ACCOUNT NO.:  [id number]   MEDICAL RECORD NO.:  [id number]          PATIENT TYPE:  OUT   LOCATION:  SLEEP CENTER                 [facility]ACILITY[facility]   PHYSICIAN:  Clinton D. Maple Hudson, M.D. DATE OF BIRTH:  Oct 15, 1981   DATE OF STUDY:  01/13/2006                              NOCTURNAL POLYSOMNOGRAM   INDICATIONS FOR STUDY:  Hypersomnia with sleep apnea.   EPWORTH SLEEPINESS SCORE:  18/24.   BMI 38.  Weight 229 pounds.   MEDICATIONS:  Labetalol, hydrochlorothiazide.   A diagnostic NPSG protocol was ordered.   SLEEP ARCHITECTURE:  Total sleep time 402 minutes with sleep efficiency 89%.  Stage I 4%, stage II 73%, stages III and IV 4%, REM 17% of total sleep time.  Sleep latency 12 minutes, REM latency 88 minutes, awake after sleep onset 38  minutes, arousal index increased at 74 indicating sleep fragmentation.  On  labetalol was taken at 2210 p.m.   RESPIRATORY DATA:  NPSG protocol.  Apnea/hypopnea index (AHI, RDI) 68.4  obstructive events per hour indicating severe obstructive sleep  apnea/hypopnea syndrome.  This included 236 obstructive apneas and 223  hypopneas.  Events were not positional.  REM AHI 116.5 per hour.   OXYGEN DATA:  Very loud snoring with oxygen desaturation to a nadir of 81%.  Mean oxygen saturation through the study was 97% on room air.   CARDIAC DATA:  Normal sinus rhythm.   MOVEMENT/PARASOMNIA:  Occasional limb jerks with little effect on sleep.  Bathroom x1.   IMPRESSION/RECOMMENDATIONS:  1.  Severe obstructive sleep apnea/hypopnea syndrome, AHI 68.4 per hour with      nonpositional events, loud      snoring and oxygen desaturation to 81%.  2.  Consider return for CPAP titration or evaluate for alternative therapies      as appropriate.      Clinton D. Maple Hudson, M.D.  Diplomate, Biomedical engineer of Sleep Medicine  Electronically Signed     [place]/MEDQ  D:  01/29/2006 13:01:54  T:  01/30/2006 08:14:28  Job:  [id number]

## 2011-03-04 NOTE — Discharge Summary (Signed)
[name], [name]              ACCOUNT NO.:  [id number]   MEDICAL RECORD NO.:  [id number]          PATIENT TYPE:  INP   LOCATION:  9373                          [facility]ACILITY:  [facility]   PHYSICIAN:  Conni Elliot, M.D.DATE OF BIRTH:  [birth year]   DATE OF ADMISSION:  02/03/2005  DATE OF DISCHARGE:  02/06/2005                                 DISCHARGE SUMMARY   ADMISSION DIAGNOSES:  1.  Postpartum preeclampsia.  2.  Type 2 diabetes mellitus.  3.  Status post spontaneous vaginal delivery on January 20, 2005.   DISCHARGE DIAGNOSES:  1.  Preeclampsia.  2.  Type 2 diabetes.   DISCHARGE MEDICATION:  Labetalol 200 mg p.o. b.i.d.   HOSPITAL COURSE:  This is a 30 year old African-American female who  presented to [facility] for a headache for 1-2 days.  She  had had increased blood pressure and diabetes during pregnancy and was  status post a spontaneous vaginal delivery by 14 days.  She had been taking  labetalol at home and had been seen in the MAU two times prior for similar  complaints.  Blood pressure was 150-198/89-110, pH __________ 155, uric acid  7.2, AST 16, ALT 19, urine dipstick that was normal.  She was admitted for  preeclampsia, started on magnesium sulfate, bed rest, given Tylenol 3 for  her headache which seemed to relieve her headache somewhat, however did not  completely do the job.  She was continued on magnesium for 2 days,  eventually had relief of her headache with the use of Maxalt.  Blood  pressures were down to 136-155/88-102 on date of discharge.  The patient was  diuresing well with negative 880 fluid balance during the 8 hours prior to  discharge.  Her headache had resolved and had not returned.  She was sent  home with instructions to follow up with her primary care physician for her  blood pressures.  Final blood pressure at discharge was 138/80.  The patient  still to follow up for her 6-week postpartum check in approximately 4 weeks.      TH/MEDQ  D:  02/06/2005  T:  02/07/2005  Job:  [id number]

## 2011-04-04 ENCOUNTER — Encounter (HOSPITAL_COMMUNITY): Payer: BC Managed Care – PPO | Admitting: Psychiatry

## 2011-04-04 DIAGNOSIS — F988 Other specified behavioral and emotional disorders with onset usually occurring in childhood and adolescence: Secondary | ICD-10-CM

## 2011-06-01 ENCOUNTER — Other Ambulatory Visit: Payer: Self-pay | Admitting: Gastroenterology

## 2011-06-01 DIAGNOSIS — R112 Nausea with vomiting, unspecified: Secondary | ICD-10-CM

## 2011-06-08 ENCOUNTER — Ambulatory Visit
Admission: RE | Admit: 2011-06-08 | Discharge: 2011-06-08 | Disposition: A | Discharge status: Home / Self Care | Payer: BC Managed Care – PPO | Source: Ambulatory Visit | Attending: Gastroenterology | Admitting: Gastroenterology

## 2011-06-08 DIAGNOSIS — R112 Nausea with vomiting, unspecified: Secondary | ICD-10-CM

## 2011-06-10 ENCOUNTER — Other Ambulatory Visit (HOSPITAL_COMMUNITY): Payer: Self-pay | Admitting: Gastroenterology

## 2011-06-10 DIAGNOSIS — R112 Nausea with vomiting, unspecified: Secondary | ICD-10-CM

## 2011-06-27 ENCOUNTER — Other Ambulatory Visit (HOSPITAL_COMMUNITY): Payer: BC Managed Care – PPO

## 2011-06-29 ENCOUNTER — Encounter (HOSPITAL_BASED_OUTPATIENT_CLINIC_OR_DEPARTMENT_OTHER)
Admission: RE | Admit: 2011-06-29 | Discharge: 2011-06-29 | Disposition: A | Discharge status: Home / Self Care | Payer: BC Managed Care – PPO | Source: Ambulatory Visit | Attending: Orthopedic Surgery | Admitting: Orthopedic Surgery

## 2011-06-29 LAB — BASIC METABOLIC PANEL
CO2: 29 mEq/L (ref 19–32)
Calcium: 9.3 mg/dL (ref 8.4–10.5)
Chloride: 102 mEq/L (ref 96–112)
Sodium: 140 mEq/L (ref 135–145)

## 2011-06-30 ENCOUNTER — Ambulatory Visit (HOSPITAL_BASED_OUTPATIENT_CLINIC_OR_DEPARTMENT_OTHER)
Admission: RE | Admit: 2011-06-30 | Discharge: 2011-06-30 | Disposition: A | Discharge status: Home / Self Care | Payer: BC Managed Care – PPO | Source: Ambulatory Visit | Attending: Orthopedic Surgery | Admitting: Orthopedic Surgery

## 2011-06-30 DIAGNOSIS — L910 Hypertrophic scar: Secondary | ICD-10-CM | POA: Insufficient documentation

## 2011-06-30 DIAGNOSIS — G5600 Carpal tunnel syndrome, unspecified upper limb: Secondary | ICD-10-CM | POA: Insufficient documentation

## 2011-06-30 DIAGNOSIS — Z01810 Encounter for preprocedural cardiovascular examination: Secondary | ICD-10-CM | POA: Insufficient documentation

## 2011-06-30 DIAGNOSIS — Z01812 Encounter for preprocedural laboratory examination: Secondary | ICD-10-CM | POA: Insufficient documentation

## 2011-07-08 LAB — POCT PREGNANCY, URINE: Operator id: 239701

## 2011-07-12 NOTE — Op Note (Signed)
[name], [name]              ACCOUNT NO.:  [id number]  MEDICAL RECORD NO.:  [id number]  LOCATION:                                 FACILITY:  PHYSICIAN:  [name]. June Rode, M.D. DATE OF BIRTH:  June 23, 1981  DATE OF PROCEDURE:  06/30/2011 DATE OF DISCHARGE:                              OPERATIVE REPORT   This is a re-dictation on July 09, 2011, as the original OP note has been lost in the health information system.  PREOPERATIVE DIAGNOSES:  Persistent right median neuropathy, status post prior right carpal tunnel release on April 15, 2010, with clinical examination revealing signs of persistent median nerve entrapment neuropathy at distal wrist flexion crease and contemporary electrodiagnostic studies obtained on May 25, 2011, documenting prolongation of the right median motor latency, the right median sensory latency, and a prolonged lumbrical interosseous difference, completed by Dr. Wadie Molina.  POSTOPERATIVE DIAGNOSIS:  Same.  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  OPERATING SURGEON:  [name]. Mckenzie Pardy, MD  ASSISTANT:  Mckenzie Rusk, PA-C  ANESTHESIA:  General by LMA.  SUPERVISING ANESTHESIOLOGIST:  See anesthesia sheet.  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  She is status post bilateral carpal tunnel release performed in 2011.  She has had excellent relief of her symptoms on the left, but after surgery, she had only transient relief of her symptoms on the right. She was improved for several months and then developed recurrent dysesthesias and nighttime numbness.  We carefully evaluated  her in 2012 with clinical examination on May 25, 2011, electrodiagnostic studies on May 25, 2011, and demonstrated a failure to respond to injection and nonoperative management including splinting.  The clinical examination suggested recurrent compression at the distal wrist flexion crease, therefore, we recommended proceeding with re- exploration at this time.  Preoperatively, she was interviewed by Anesthesia.  General anesthesia by LMA technique was recommended and accepted.  We anticipate perioperative local anesthesia of the wound for comfort.  DETAILS OF PROCEDURE:  [name] was brought to room 1 of the [facility] and placed in supine position on the operating table.  Following the induction of general anesthesia by LMA technique, the right arm was prepped with Betadine soap solution and sterilely draped.  A pneumatic tourniquet was applied at proximal right brachium.  On exsanguination of the right arm with Esmarch bandage, arterial tourniquet was inflated to 220 mmHg.  Procedure commenced with resection of the prior surgical scar in the palm followed by proximal extension of the incision 1 cm.  Subcutaneous tissues were carefully divided taking care to identify the healed palmar fascia, which was split longitudinally.  The mid palmar space was identified, the superficial palmar arch was identified, the common sensory branches of the median nerve were identified, and the epineurium of the median nerve was carefully dissected proximally into the area of dense scarring noted in the ulnar bursa.  With great care, the ulnar aspect of the transverse carpal ligament was identified and sequentially released with tenotomy scissors under direct vision to the level of the distal wrist flexion crease.  Care was taken to separate the median nerve from the tenosynovium and the rather dense keloid type scar.  At the level of the distal wrist flexion crease, there  was a very prominent scar that measured nearly 8-10 mm in thickness and was clearly entrapping and adherent to the median nerve.  This was a rather atypical keloid-type response that may have been due to a small hematoma in this region forming postoperatively.  Due to the density of the scar and concern about possibly iatrogenic injury to the median nerve with vigorous dissection and spreading of the scar, I elected to perform a second incision directly at the distal wrist flexion crease followed by subcutaneous dissection, identification of the median nerve proximal to the area of scarring followed by proximal and distal dissection on the epineural plane followed by resection of the scar to level of the dermis.  A large block of scar was resected and great care was taken to protect the median nerve throughout dissection.  This widely decompressed the median nerve and allowed me to admit my small finger on top of the nerve through the carpal canal and into the distal forearm 5 cm above the distal wrist flexion crease.  Care was taken to electrocauterize with bipolar forceps all visible vessels.  Tourniquet was released and hemostasis was achieved with bipolar cautery and direct pressure.  Both wounds were then repaired with intradermal 3-0 Prolene suture and Steri-Strips.  2% lidocaine was infiltrated for postoperative analgesia.  There were no apparent complications.  [name] was placed in compressive dressing with sterile gauze, sterile Webril, and a volar plaster splint.  She will return for followup in our office in 1 week for dressing change, suture removal, and advancement to a postoperative exercise program.  Our findings at surgery confirmed the presence of entrapping scar consistent with a keloid-type response postoperatively.  There were no apparent complications.     [name] [name], M.D.     [facility]/MEDQ  D:  07/09/2011  T:  07/09/2011  Job:   [id number]  cc:   [name]  Electronically Signed by [name] M.D. on 07/12/2011 10:00:14 AM

## 2011-07-31 ENCOUNTER — Emergency Department (HOSPITAL_COMMUNITY)
Admission: EM | Admit: 2011-07-31 | Discharge: 2011-08-01 | Disposition: A | Discharge status: Home / Self Care | Payer: BC Managed Care – PPO | Attending: Emergency Medicine | Admitting: Emergency Medicine

## 2011-07-31 ENCOUNTER — Emergency Department (HOSPITAL_COMMUNITY): Payer: BC Managed Care – PPO

## 2011-07-31 DIAGNOSIS — R05 Cough: Secondary | ICD-10-CM | POA: Insufficient documentation

## 2011-07-31 DIAGNOSIS — F329 Major depressive disorder, single episode, unspecified: Secondary | ICD-10-CM | POA: Insufficient documentation

## 2011-07-31 DIAGNOSIS — R0609 Other forms of dyspnea: Secondary | ICD-10-CM | POA: Insufficient documentation

## 2011-07-31 DIAGNOSIS — R059 Cough, unspecified: Secondary | ICD-10-CM | POA: Insufficient documentation

## 2011-07-31 DIAGNOSIS — R0602 Shortness of breath: Secondary | ICD-10-CM | POA: Insufficient documentation

## 2011-07-31 DIAGNOSIS — F3289 Other specified depressive episodes: Secondary | ICD-10-CM | POA: Insufficient documentation

## 2011-07-31 DIAGNOSIS — I10 Essential (primary) hypertension: Secondary | ICD-10-CM | POA: Insufficient documentation

## 2011-07-31 DIAGNOSIS — R064 Hyperventilation: Secondary | ICD-10-CM | POA: Insufficient documentation

## 2011-07-31 DIAGNOSIS — R0789 Other chest pain: Secondary | ICD-10-CM | POA: Insufficient documentation

## 2011-07-31 DIAGNOSIS — F411 Generalized anxiety disorder: Secondary | ICD-10-CM | POA: Insufficient documentation

## 2011-07-31 DIAGNOSIS — J45909 Unspecified asthma, uncomplicated: Secondary | ICD-10-CM | POA: Insufficient documentation

## 2011-07-31 DIAGNOSIS — R0989 Other specified symptoms and signs involving the circulatory and respiratory systems: Secondary | ICD-10-CM | POA: Insufficient documentation

## 2011-07-31 LAB — CBC
HCT: 36.2 % (ref 36.0–46.0)
Platelets: 329 10*3/uL (ref 150–400)
RBC: 4.83 MIL/uL (ref 3.87–5.11)
RDW: 14.9 % (ref 11.5–15.5)
WBC: 9.9 10*3/uL (ref 4.0–10.5)

## 2011-07-31 LAB — POCT I-STAT TROPONIN I: Troponin i, poc: 0 ng/mL (ref 0.00–0.08)

## 2011-07-31 LAB — URINALYSIS, ROUTINE W REFLEX MICROSCOPIC
Ketones, ur: 15 mg/dL — AB
Nitrite: NEGATIVE
Protein, ur: NEGATIVE mg/dL
Urobilinogen, UA: 0.2 mg/dL (ref 0.0–1.0)

## 2011-07-31 LAB — DIFFERENTIAL
Basophils Absolute: 0 10*3/uL (ref 0.0–0.1)
Eosinophils Absolute: 0 10*3/uL (ref 0.0–0.7)
Eosinophils Relative: 0 % (ref 0–5)
Lymphocytes Relative: 33 % (ref 12–46)
Neutrophils Relative %: 61 % (ref 43–77)

## 2011-07-31 LAB — POCT PREGNANCY, URINE: Preg Test, Ur: NEGATIVE

## 2011-07-31 MED ORDER — IOHEXOL 350 MG/ML SOLN
80.0000 mL | Freq: Once | INTRAVENOUS | Status: AC | PRN
  Administered 2011-07-31: 80 mL via INTRAVENOUS

## 2011-08-01 ENCOUNTER — Emergency Department (HOSPITAL_COMMUNITY)
Admission: EM | Admit: 2011-08-01 | Discharge: 2011-08-01 | Discharge status: Against Medical Advice | Payer: BC Managed Care – PPO | Attending: Emergency Medicine | Admitting: Emergency Medicine

## 2011-08-01 DIAGNOSIS — I10 Essential (primary) hypertension: Secondary | ICD-10-CM | POA: Insufficient documentation

## 2011-08-01 DIAGNOSIS — R0989 Other specified symptoms and signs involving the circulatory and respiratory systems: Secondary | ICD-10-CM | POA: Insufficient documentation

## 2011-08-01 DIAGNOSIS — J45909 Unspecified asthma, uncomplicated: Secondary | ICD-10-CM | POA: Insufficient documentation

## 2011-08-01 DIAGNOSIS — F3289 Other specified depressive episodes: Secondary | ICD-10-CM | POA: Insufficient documentation

## 2011-08-01 DIAGNOSIS — F411 Generalized anxiety disorder: Secondary | ICD-10-CM | POA: Insufficient documentation

## 2011-08-01 DIAGNOSIS — F329 Major depressive disorder, single episode, unspecified: Secondary | ICD-10-CM | POA: Insufficient documentation

## 2011-08-01 DIAGNOSIS — R0682 Tachypnea, not elsewhere classified: Secondary | ICD-10-CM | POA: Insufficient documentation

## 2011-08-01 DIAGNOSIS — R109 Unspecified abdominal pain: Secondary | ICD-10-CM | POA: Insufficient documentation

## 2011-08-01 DIAGNOSIS — R000 Tachycardia, unspecified: Secondary | ICD-10-CM | POA: Insufficient documentation

## 2011-08-01 DIAGNOSIS — R0609 Other forms of dyspnea: Secondary | ICD-10-CM | POA: Insufficient documentation

## 2011-08-01 DIAGNOSIS — Z79899 Other long term (current) drug therapy: Secondary | ICD-10-CM | POA: Insufficient documentation

## 2011-08-01 DIAGNOSIS — R0789 Other chest pain: Secondary | ICD-10-CM | POA: Insufficient documentation

## 2011-08-01 LAB — COMPREHENSIVE METABOLIC PANEL
Albumin: 4.3 g/dL (ref 3.5–5.2)
Alkaline Phosphatase: 69 U/L (ref 39–117)
BUN: 12 mg/dL (ref 6–23)
CO2: 12 mEq/L — ABNORMAL LOW (ref 19–32)
Chloride: 104 mEq/L (ref 96–112)
Glucose, Bld: 192 mg/dL — ABNORMAL HIGH (ref 70–99)
Potassium: 2.7 mEq/L — CL (ref 3.5–5.1)
Total Bilirubin: 0.6 mg/dL (ref 0.3–1.2)

## 2011-08-01 LAB — POCT I-STAT 3, ART BLOOD GAS (G3+)
Bicarbonate: 10.9 mEq/L — ABNORMAL LOW (ref 20.0–24.0)
O2 Saturation: 99 %
TCO2: 11 mmol/L (ref 0–100)
pCO2 arterial: 12.8 mmHg — CL (ref 35.0–45.0)
pO2, Arterial: 124 mmHg — ABNORMAL HIGH (ref 80.0–100.0)

## 2011-08-01 LAB — RAPID URINE DRUG SCREEN, HOSP PERFORMED
Cocaine: NOT DETECTED
Tetrahydrocannabinol: POSITIVE — AB

## 2011-08-01 LAB — URINALYSIS, ROUTINE W REFLEX MICROSCOPIC
Ketones, ur: 15 — AB
Nitrite: NEGATIVE
Protein, ur: 100 — AB
pH: 6

## 2011-08-01 LAB — CBC
HCT: 35.8 — ABNORMAL LOW
Hemoglobin: 11.8 — ABNORMAL LOW
RBC: 4.66
RDW: 14.5 — ABNORMAL HIGH
WBC: 4.7

## 2011-08-01 LAB — I-STAT 8, (EC8 V) (CONVERTED LAB)
Acid-Base Excess: 1
BUN: 10
Chloride: 105
HCT: 40
Potassium: 3.4 — ABNORMAL LOW
pCO2, Ven: 43.1 — ABNORMAL LOW
pH, Ven: 7.394 — ABNORMAL HIGH

## 2011-08-01 LAB — DIFFERENTIAL
Basophils Absolute: 0
Eosinophils Relative: 0
Lymphocytes Relative: 40
Lymphs Abs: 1.9
Monocytes Absolute: 0.3
Neutro Abs: 2.5

## 2011-08-01 LAB — URINE MICROSCOPIC-ADD ON

## 2011-08-01 LAB — POCT I-STAT CREATININE
Creatinine, Ser: 0.9
Operator id: 294521

## 2011-08-01 LAB — ETHANOL
Alcohol, Ethyl (B): <11 mg/dL (ref 0–11)
Alcohol, Ethyl (B): <5

## 2011-08-01 LAB — POCT PREGNANCY, URINE: Operator id: 288831

## 2011-08-18 DEATH — deceased

## 2011-10-13 ENCOUNTER — Other Ambulatory Visit (HOSPITAL_COMMUNITY): Payer: Self-pay | Admitting: Gastroenterology

## 2011-10-20 ENCOUNTER — Ambulatory Visit (INDEPENDENT_AMBULATORY_CARE_PROVIDER_SITE_OTHER): Payer: BC Managed Care – PPO | Admitting: Physician Assistant

## 2011-10-20 DIAGNOSIS — F988 Other specified behavioral and emotional disorders with onset usually occurring in childhood and adolescence: Secondary | ICD-10-CM

## 2011-10-20 MED ORDER — AMPHETAMINE-DEXTROAMPHET ER 20 MG PO CP24: 20.0000 mg | ORAL_CAPSULE | Freq: Every day | ORAL | Status: DC

## 2011-10-20 NOTE — Progress Notes (Signed)
   [facility] Behavioral [facility] Follow-up Outpatient Visit  JALIYAH FOTHERINGHAM 02/21/1981  Date: 10/20/11   Subjective: Penelope presents today for followup on her ADHD medication. She had previously been seen by Dr. Lolly Mustache, and Verne Spurr, prior to that. She was last prescribed Adderall X. R. 20 mg daily. She reports that that medication was working well when she last took it, which was several months ago. 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 She had carpal tunnel surgery this past summer, and continues to have difficulty with her wrists. She is looking for other work. Also, her husband has a significant genetic illness for which they need to take him to [facility] for treatment. Additionally, she cares for several children. She reports that because of these stresses she has been experiencing some anxiety, but she is able to use breathing exercises to control that.  There were no vitals filed for this visit.  Mental Status Examination  Appearance: Well groomed and dressed Alert: Yes Attention: good  Cooperative: Yes Eye Contact: Good Speech: Clear and even Psychomotor Activity: Normal Memory/Concentration: Intact Oriented: person, place, time/date and situation Mood: Euthymic Affect: Congruent Thought Processes and Associations: Linear Fund of Knowledge: Good Thought Content:  Insight: Good Judgement: Good  Diagnosis: ADHD, inattentive type  Treatment Plan: Continue Adderall, consider Vyvanse if finances allow. Followup in 3 months.  Elizet Kaplan, PA

## 2011-11-02 ENCOUNTER — Encounter (HOSPITAL_COMMUNITY)
Admission: RE | Admit: 2011-11-02 | Discharge: 2011-11-02 | Disposition: A | Discharge status: Home / Self Care | Payer: BC Managed Care – PPO | Source: Ambulatory Visit | Attending: Gastroenterology | Admitting: Gastroenterology

## 2011-11-02 DIAGNOSIS — R112 Nausea with vomiting, unspecified: Secondary | ICD-10-CM | POA: Insufficient documentation

## 2011-11-02 MED ORDER — SINCALIDE 5 MCG IJ SOLR: 0.0200 ug/kg | Freq: Once | INTRAMUSCULAR | Status: DC

## 2011-11-02 MED ORDER — TECHNETIUM TC 99M MEBROFENIN IV KIT
5.5000 | PACK | Freq: Once | INTRAVENOUS | Status: AC | PRN
  Administered 2011-11-02: 5.5 via INTRAVENOUS

## 2012-01-18 ENCOUNTER — Ambulatory Visit (HOSPITAL_COMMUNITY): Payer: BC Managed Care – PPO | Admitting: Physician Assistant

## 2012-01-25 ENCOUNTER — Ambulatory Visit (INDEPENDENT_AMBULATORY_CARE_PROVIDER_SITE_OTHER): Payer: BC Managed Care – PPO | Admitting: Physician Assistant

## 2012-01-25 DIAGNOSIS — F988 Other specified behavioral and emotional disorders with onset usually occurring in childhood and adolescence: Secondary | ICD-10-CM

## 2012-01-25 DIAGNOSIS — F909 Attention-deficit hyperactivity disorder, unspecified type: Secondary | ICD-10-CM

## 2012-01-25 MED ORDER — AMPHETAMINE-DEXTROAMPHET ER 20 MG PO CP24: 20.0000 mg | ORAL_CAPSULE | Freq: Every day | ORAL | Status: DC

## 2012-01-31 NOTE — Progress Notes (Signed)
   [facility] Behavioral Health Follow-up Outpatient Visit  [name] 05/22/1981  Date: 01/25/2012   Subjective: [name] presents today to followup on her medications prescribed for ADHD she reports that she has been under significant stress recently, citing that her husband's condition has worsened and they have been back-and-forth to [facility] for treatment. She has had to request a medical leave of absence from school. Her carpal tunnel surgery did not work. The position with [employer] that she applied for is in limbo because of past charges of communicating threats which were dropped. She also is experiencing financial difficulties. She feels that the Adderall is working well for her ADHD, and she sees no reason to change the dose. At her last visit we had discussed possibly changing to Vyvanse, but in light of her financial difficulties, she feels it is best we stay with Adderall. She denies any suicidal or homicidal ideation. She denies any auditory or visual hallucinations.  There were no vitals filed for this visit.  Mental Status Examination  Appearance: Well groomed and well dressed Alert: Yes Attention: good  Cooperative: Yes Eye Contact: Good Speech: Clear and even Psychomotor Activity: Normal Memory/Concentration: Intact Oriented: person, place, time/date and situation Mood: Anxious Affect: Congruent Thought Processes and Associations: Linear Fund of Knowledge: Good Thought Content: Normal Insight: Good Judgement: Good  Diagnosis: ADHD, inattentive type  Treatment Plan: We will continue Adderall XR 20 mg daily and she will followup in 3 months.  Shown Dissinger, PA-C

## 2012-03-15 IMAGING — NM NM HEPATO W/GB/PHARM/[PERSON_NAME]
2 series · 12 of 12 positions shown · non-contrast
Comparison: None.

CLINICAL DATA: Nausea and vomiting

NUCLEAR MEDICINE HEPATOBILIARY IMAGING WITH GALLBLADDER EF
TECHNIQUE: Sequential images of the abdomen were obtained [DATE] minutes following intravenous administration of
radiopharmaceutical.  After slow intravenous infusion of 2.0 L
micrograms Cholecystokinin, gallbladder ejection fraction was
determined.
Radiopharmaceutical:  5.5 mCi Jc-RRm Choletec

[Series 1: gb hepatobiliary scan · 4.75mm/px · 6 of 30 frames shown (1 of 2)]
[frame 3/30]
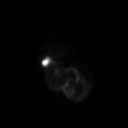
[frame 8/30]
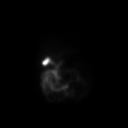
[frame 13/30]
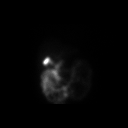
[frame 18/30]
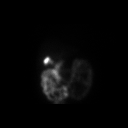
[frame 23/30]
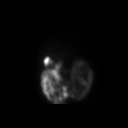
[frame 28/30]
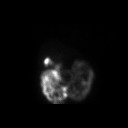

[Series 1: gb hepatobiliary scan · 4.75mm/px · 6 of 60 frames shown (2 of 2)]
[frame 6/60]
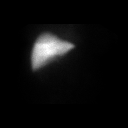
[frame 16/60]
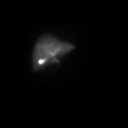
[frame 26/60]
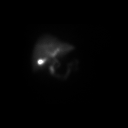
[frame 36/60]
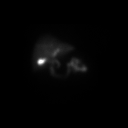
[frame 46/60]
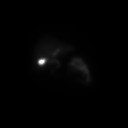
[frame 56/60]
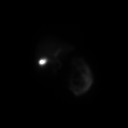

[12 of 12 positions shown; findings below may reference images not displayed]

FINDINGS: There is homogeneous uptake of radiopharmaceutical
throughout the liver.  The gallbladder begins to fill at 15
minutes.  The small bowel begins to fill at 20 minutes.  The
gallbladder ejection fraction is normal, at 80.9%.

The patient did notexperience symptoms during CCK infusion.
IMPRESSION: There is no evidence of common duct or cystic duct obstruction.

Normal gallbladder ejection fraction at 80.9%.

## 2012-04-25 ENCOUNTER — Ambulatory Visit (INDEPENDENT_AMBULATORY_CARE_PROVIDER_SITE_OTHER): Payer: BC Managed Care – PPO | Admitting: Physician Assistant

## 2012-04-25 DIAGNOSIS — F988 Other specified behavioral and emotional disorders with onset usually occurring in childhood and adolescence: Secondary | ICD-10-CM | POA: Insufficient documentation

## 2012-04-25 DIAGNOSIS — F909 Attention-deficit hyperactivity disorder, unspecified type: Secondary | ICD-10-CM

## 2012-04-25 MED ORDER — AMPHETAMINE-DEXTROAMPHET ER 30 MG PO CP24: 30.0000 mg | ORAL_CAPSULE | ORAL | Status: DC

## 2012-04-25 NOTE — Progress Notes (Signed)
   [facility] Follow-up Outpatient Visit  [name] 1981/03/23  Date:  04/25/2012   Subjective: [name] presents today to followup on her treatment for ADHD. She reports that her life is pretty crazy. Her husband has officially been diagnosed with muscular dystrophy and his prognosis is that he will continue to lose use of his muscles. He is currently unable to drive more than about 20 minutes without developing double vision and tremors. That, with the addition of her job and children, causes her to become overwhelmed at times. She reports that her mood is up and down, and she monitors herself closely so that when she becomes overwhelmed she can stop and rest and do good things to take care of herself. She reports that her sleep is good, and that in the evening when she is able to rest, she "crashes." She feels that her Adderall dose could be increased. She reports that she has not been eating well, in part because of her husband's hospitalization. Even so, her weight has increased and her diabetes has worsened. She has had lab work done and her vitamin D level and iron levels are both low. She reports also that her energy is poor. She is getting involved with the bariatric clinic, as well as an endocrinologist to help her with her weight and diabetes. She denies any suicidal or homicidal ideation. She denies any auditory or visual hallucinations.  There were no vitals filed for this visit.  Mental Status Examination  Appearance: Fairly groomed and casually dressed Alert: Yes Attention: good  Cooperative: Yes Eye Contact: Good Speech: Clear and coherent Psychomotor Activity: Normal Memory/Concentration: Intact Oriented: person, place, time/date and situation Mood: Dysphoric Affect: Congruent Thought Processes and Associations: Linear Fund of Knowledge: Good Thought Content: Normal Insight: Good Judgement: Good  Diagnosis: ADHD, inattentive type  Treatment Plan: We  will increase her Adderall XR to 30 mg daily and she will return for followup in 3 months. She has been instructed to call if the increases too much, and we can bump it back down to 25 mg. She also has been told that if things are too overwhelming for her we could possibly start her on an SSRI  Silena Wyss, PA-C

## 2012-06-20 ENCOUNTER — Other Ambulatory Visit (HOSPITAL_COMMUNITY): Payer: Self-pay | Admitting: *Deleted

## 2012-06-20 ENCOUNTER — Telehealth (HOSPITAL_COMMUNITY): Payer: Self-pay

## 2012-06-20 MED ORDER — AMPHETAMINE-DEXTROAMPHET ER 30 MG PO CP24: 30.0000 mg | ORAL_CAPSULE | ORAL | Status: DC

## 2012-06-20 NOTE — Telephone Encounter (Signed)
Has been moving.In process of moving, lost RX for Adderall. Received 3 RX last appt.  Can she get a replacement?

## 2012-06-20 NOTE — Telephone Encounter (Signed)
06/20/12 Called pt and left msg that rx script for Aderall is ready for pick-up./sh

## 2012-06-21 ENCOUNTER — Telehealth (HOSPITAL_COMMUNITY): Payer: Self-pay | Admitting: *Deleted

## 2012-06-21 NOTE — Telephone Encounter (Signed)
Contacted pt to let her know RX written to replace RX lost during move.Patient stated she had found the RX given to her in July.  New RX destroyed.

## 2012-07-26 ENCOUNTER — Ambulatory Visit (HOSPITAL_COMMUNITY): Payer: Self-pay | Admitting: Physician Assistant

## 2013-06-12 ENCOUNTER — Ambulatory Visit (HOSPITAL_COMMUNITY): Payer: Self-pay | Admitting: Physician Assistant

## 2016-04-20 ENCOUNTER — Encounter (HOSPITAL_BASED_OUTPATIENT_CLINIC_OR_DEPARTMENT_OTHER): Payer: Self-pay | Admitting: *Deleted

## 2016-04-21 ENCOUNTER — Other Ambulatory Visit: Payer: Self-pay | Admitting: Orthopedic Surgery

## 2016-04-25 ENCOUNTER — Encounter (HOSPITAL_BASED_OUTPATIENT_CLINIC_OR_DEPARTMENT_OTHER)
Admission: RE | Admit: 2016-04-25 | Discharge: 2016-04-25 | Disposition: A | Discharge status: Home / Self Care | Payer: PRIVATE HEALTH INSURANCE | Source: Ambulatory Visit | Attending: Orthopedic Surgery | Admitting: Orthopedic Surgery

## 2016-04-25 DIAGNOSIS — I10 Essential (primary) hypertension: Secondary | ICD-10-CM | POA: Diagnosis not present

## 2016-04-25 DIAGNOSIS — Z79899 Other long term (current) drug therapy: Secondary | ICD-10-CM | POA: Diagnosis not present

## 2016-04-25 DIAGNOSIS — Z7984 Long term (current) use of oral hypoglycemic drugs: Secondary | ICD-10-CM | POA: Diagnosis not present

## 2016-04-25 DIAGNOSIS — K219 Gastro-esophageal reflux disease without esophagitis: Secondary | ICD-10-CM | POA: Diagnosis not present

## 2016-04-25 DIAGNOSIS — F329 Major depressive disorder, single episode, unspecified: Secondary | ICD-10-CM | POA: Diagnosis not present

## 2016-04-25 DIAGNOSIS — G5601 Carpal tunnel syndrome, right upper limb: Secondary | ICD-10-CM | POA: Diagnosis not present

## 2016-04-25 DIAGNOSIS — G4730 Sleep apnea, unspecified: Secondary | ICD-10-CM | POA: Diagnosis not present

## 2016-04-25 DIAGNOSIS — E119 Type 2 diabetes mellitus without complications: Secondary | ICD-10-CM | POA: Diagnosis not present

## 2016-04-25 LAB — CBC
HEMATOCRIT: 37.6 % (ref 36.0–46.0)
HEMOGLOBIN: 12 g/dL (ref 12.0–15.0)
MCH: 24.5 pg — AB (ref 26.0–34.0)
MCHC: 31.9 g/dL (ref 30.0–36.0)
MCV: 76.7 fL — ABNORMAL LOW (ref 78.0–100.0)
Platelets: 246 10*3/uL (ref 150–400)
RBC: 4.9 MIL/uL (ref 3.87–5.11)
RDW: 16 % — ABNORMAL HIGH (ref 11.5–15.5)
WBC: 7.1 10*3/uL (ref 4.0–10.5)

## 2016-04-25 LAB — BASIC METABOLIC PANEL
ANION GAP: 7 (ref 5–15)
BUN: 15 mg/dL (ref 6–20)
CHLORIDE: 104 mmol/L (ref 101–111)
CO2: 28 mmol/L (ref 22–32)
Calcium: 9.4 mg/dL (ref 8.9–10.3)
Creatinine, Ser: 0.78 mg/dL (ref 0.44–1.00)
GFR calc Af Amer: >60 mL/min (ref >60)
GLUCOSE: 134 mg/dL — AB (ref 65–99)
POTASSIUM: 2.9 mmol/L — AB (ref 3.5–5.1)
Sodium: 139 mmol/L (ref 135–145)

## 2016-04-26 NOTE — Progress Notes (Signed)
PAT labs reviewed with Dr. Desmond Lopeurk. No need to repeat dos.

## 2016-04-27 ENCOUNTER — Ambulatory Visit (HOSPITAL_BASED_OUTPATIENT_CLINIC_OR_DEPARTMENT_OTHER)
Admission: RE | Admit: 2016-04-27 | Discharge: 2016-04-27 | Disposition: A | Discharge status: Home / Self Care | Payer: 59 | Source: Ambulatory Visit | Attending: Orthopedic Surgery | Admitting: Orthopedic Surgery

## 2016-04-27 ENCOUNTER — Encounter (HOSPITAL_BASED_OUTPATIENT_CLINIC_OR_DEPARTMENT_OTHER): Payer: Self-pay | Admitting: Certified Registered"

## 2016-04-27 ENCOUNTER — Encounter (HOSPITAL_BASED_OUTPATIENT_CLINIC_OR_DEPARTMENT_OTHER)
Admission: RE | Disposition: A | Discharge status: Home / Self Care | Payer: Self-pay | Source: Ambulatory Visit | Attending: Orthopedic Surgery

## 2016-04-27 ENCOUNTER — Ambulatory Visit (HOSPITAL_BASED_OUTPATIENT_CLINIC_OR_DEPARTMENT_OTHER): Payer: 59 | Admitting: Certified Registered"

## 2016-04-27 DIAGNOSIS — Z7984 Long term (current) use of oral hypoglycemic drugs: Secondary | ICD-10-CM | POA: Insufficient documentation

## 2016-04-27 DIAGNOSIS — I10 Essential (primary) hypertension: Secondary | ICD-10-CM | POA: Insufficient documentation

## 2016-04-27 DIAGNOSIS — K219 Gastro-esophageal reflux disease without esophagitis: Secondary | ICD-10-CM | POA: Insufficient documentation

## 2016-04-27 DIAGNOSIS — F329 Major depressive disorder, single episode, unspecified: Secondary | ICD-10-CM | POA: Insufficient documentation

## 2016-04-27 DIAGNOSIS — G4730 Sleep apnea, unspecified: Secondary | ICD-10-CM | POA: Insufficient documentation

## 2016-04-27 DIAGNOSIS — E119 Type 2 diabetes mellitus without complications: Secondary | ICD-10-CM | POA: Insufficient documentation

## 2016-04-27 DIAGNOSIS — Z79899 Other long term (current) drug therapy: Secondary | ICD-10-CM | POA: Insufficient documentation

## 2016-04-27 DIAGNOSIS — G5601 Carpal tunnel syndrome, right upper limb: Secondary | ICD-10-CM | POA: Insufficient documentation

## 2016-04-27 HISTORY — DX: Type 2 diabetes mellitus without complications: E11.9

## 2016-04-27 HISTORY — DX: Anemia, unspecified: D64.9

## 2016-04-27 HISTORY — DX: Anxiety disorder, unspecified: F41.9

## 2016-04-27 HISTORY — DX: Major depressive disorder, single episode, unspecified: F32.9

## 2016-04-27 HISTORY — DX: Essential (primary) hypertension: I10

## 2016-04-27 HISTORY — PX: NERVE EXPLORATION: SHX2082

## 2016-04-27 HISTORY — DX: Sleep apnea, unspecified: G47.30

## 2016-04-27 HISTORY — DX: Gastro-esophageal reflux disease without esophagitis: K21.9

## 2016-04-27 HISTORY — DX: Depression, unspecified: F32.A

## 2016-04-27 LAB — GLUCOSE, CAPILLARY
Glucose-Capillary: 133 mg/dL — ABNORMAL HIGH (ref 65–99)
Glucose-Capillary: 142 mg/dL — ABNORMAL HIGH (ref 65–99)

## 2016-04-27 SURGERY — EXPLORATION, NERVE
Anesthesia: General | Site: Wrist | Laterality: Right

## 2016-04-27 MED ORDER — CEFAZOLIN SODIUM-DEXTROSE 2-4 GM/100ML-% IV SOLN
2.0000 g | INTRAVENOUS | Status: AC
  Administered 2016-04-27: 2 g via INTRAVENOUS

## 2016-04-27 MED ORDER — DEXAMETHASONE SODIUM PHOSPHATE 10 MG/ML IJ SOLN
INTRAMUSCULAR | Status: AC
  Filled 2016-04-27: qty 1

## 2016-04-27 MED ORDER — OXYCODONE-ACETAMINOPHEN 5-325 MG PO TABS: 1.0000 | ORAL_TABLET | ORAL | Status: AC | PRN

## 2016-04-27 MED ORDER — EPHEDRINE 5 MG/ML INJ
INTRAVENOUS | Status: AC
  Filled 2016-04-27: qty 10

## 2016-04-27 MED ORDER — CEFAZOLIN SODIUM-DEXTROSE 2-4 GM/100ML-% IV SOLN
INTRAVENOUS | Status: AC
  Filled 2016-04-27: qty 100

## 2016-04-27 MED ORDER — FENTANYL CITRATE (PF) 100 MCG/2ML IJ SOLN
50.0000 ug | INTRAMUSCULAR | Status: AC | PRN
  Administered 2016-04-27 (×2): 25 ug via INTRAVENOUS
  Administered 2016-04-27 (×3): 50 ug via INTRAVENOUS
  Administered 2016-04-27 (×2): 25 ug via INTRAVENOUS

## 2016-04-27 MED ORDER — FENTANYL CITRATE (PF) 100 MCG/2ML IJ SOLN
INTRAMUSCULAR | Status: AC
  Filled 2016-04-27: qty 2

## 2016-04-27 MED ORDER — LIDOCAINE 2% (20 MG/ML) 5 ML SYRINGE
INTRAMUSCULAR | Status: AC
  Filled 2016-04-27: qty 5

## 2016-04-27 MED ORDER — GLYCOPYRROLATE 0.2 MG/ML IJ SOLN: 0.2000 mg | Freq: Once | INTRAMUSCULAR | Status: DC | PRN

## 2016-04-27 MED ORDER — HYDROCODONE-ACETAMINOPHEN 7.5-325 MG PO TABS: 1.0000 | ORAL_TABLET | Freq: Once | ORAL | Status: DC | PRN

## 2016-04-27 MED ORDER — ONDANSETRON HCL 4 MG/2ML IJ SOLN
INTRAMUSCULAR | Status: DC | PRN
  Administered 2016-04-27: 4 mg via INTRAVENOUS

## 2016-04-27 MED ORDER — SCOPOLAMINE 1 MG/3DAYS TD PT72: 1.0000 | MEDICATED_PATCH | Freq: Once | TRANSDERMAL | Status: DC | PRN

## 2016-04-27 MED ORDER — LACTATED RINGERS IV SOLN
INTRAVENOUS | Status: DC
  Administered 2016-04-27 (×2): via INTRAVENOUS

## 2016-04-27 MED ORDER — LIDOCAINE HCL (CARDIAC) 20 MG/ML IV SOLN
INTRAVENOUS | Status: DC | PRN
  Administered 2016-04-27: 60 mg via INTRAVENOUS

## 2016-04-27 MED ORDER — PROPOFOL 10 MG/ML IV BOLUS
INTRAVENOUS | Status: DC | PRN
  Administered 2016-04-27: 200 mg via INTRAVENOUS

## 2016-04-27 MED ORDER — MIDAZOLAM HCL 2 MG/2ML IJ SOLN
1.0000 mg | INTRAMUSCULAR | Status: DC | PRN
  Administered 2016-04-27: 2 mg via INTRAVENOUS

## 2016-04-27 MED ORDER — BUPIVACAINE HCL 0.25 % IJ SOLN
INTRAMUSCULAR | Status: DC | PRN
  Administered 2016-04-27: 10 mL

## 2016-04-27 MED ORDER — HYDROMORPHONE HCL 1 MG/ML IJ SOLN
INTRAMUSCULAR | Status: AC
Start: 2016-04-27 — End: 2016-04-27
  Filled 2016-04-27: qty 1

## 2016-04-27 MED ORDER — PROMETHAZINE HCL 25 MG/ML IJ SOLN: 6.2500 mg | INTRAMUSCULAR | Status: DC | PRN

## 2016-04-27 MED ORDER — HYDROMORPHONE HCL 1 MG/ML IJ SOLN
0.2500 mg | INTRAMUSCULAR | Status: DC | PRN
  Administered 2016-04-27 (×4): 0.5 mg via INTRAVENOUS

## 2016-04-27 MED ORDER — MIDAZOLAM HCL 2 MG/2ML IJ SOLN
INTRAMUSCULAR | Status: AC
  Filled 2016-04-27: qty 2

## 2016-04-27 MED ORDER — CHLORHEXIDINE GLUCONATE 4 % EX LIQD: 60.0000 mL | Freq: Once | CUTANEOUS | Status: DC

## 2016-04-27 MED ORDER — HYDROMORPHONE HCL 1 MG/ML IJ SOLN
INTRAMUSCULAR | Status: AC
  Filled 2016-04-27: qty 1

## 2016-04-27 MED ORDER — DEXAMETHASONE SODIUM PHOSPHATE 10 MG/ML IJ SOLN
INTRAMUSCULAR | Status: DC | PRN
  Administered 2016-04-27: 10 mg via INTRAVENOUS

## 2016-04-27 MED ORDER — ONDANSETRON HCL 4 MG/2ML IJ SOLN
INTRAMUSCULAR | Status: AC
  Filled 2016-04-27: qty 2

## 2016-04-27 SURGICAL SUPPLY — 62 items
APL SKNCLS STERI-STRIP NONHPOA (GAUZE/BANDAGES/DRESSINGS)
BAG DECANTER FOR FLEXI CONT (MISCELLANEOUS) IMPLANT
BANDAGE ACE 4X5 VEL STRL LF (GAUZE/BANDAGES/DRESSINGS) IMPLANT
BENZOIN TINCTURE PRP APPL 2/3 (GAUZE/BANDAGES/DRESSINGS) IMPLANT
BLADE MINI RND TIP GREEN BEAV (BLADE) IMPLANT
BLADE SURG 15 STRL LF DISP TIS (BLADE) ×1 IMPLANT
BLADE SURG 15 STRL SS (BLADE) ×2
BNDG CMPR 9X4 STRL LF SNTH (GAUZE/BANDAGES/DRESSINGS) ×1
BNDG COHESIVE 1X5 TAN STRL LF (GAUZE/BANDAGES/DRESSINGS) IMPLANT
BNDG ELASTIC 2X5.8 VLCR STR LF (GAUZE/BANDAGES/DRESSINGS) ×2 IMPLANT
BNDG ESMARK 4X9 LF (GAUZE/BANDAGES/DRESSINGS) ×2 IMPLANT
BRUSH SCRUB EZ PLAIN DRY (MISCELLANEOUS) IMPLANT
CORDS BIPOLAR (ELECTRODE) ×2 IMPLANT
COVER BACK TABLE 60X90IN (DRAPES) ×2 IMPLANT
CUFF TOURNIQUET SINGLE 18IN (TOURNIQUET CUFF) IMPLANT
DECANTER SPIKE VIAL GLASS SM (MISCELLANEOUS) IMPLANT
DRAPE EXTREMITY T 121X128X90 (DRAPE) ×2 IMPLANT
DRAPE SURG 17X23 STRL (DRAPES) ×2 IMPLANT
DURAPREP 26ML APPLICATOR (WOUND CARE) ×2 IMPLANT
GAUZE SPONGE 4X4 12PLY STRL (GAUZE/BANDAGES/DRESSINGS) ×2 IMPLANT
GAUZE SPONGE 4X4 16PLY XRAY LF (GAUZE/BANDAGES/DRESSINGS) IMPLANT
GAUZE XEROFORM 1X8 LF (GAUZE/BANDAGES/DRESSINGS) IMPLANT
GLOVE BIO SURGEON STRL SZ 6.5 (GLOVE) ×2 IMPLANT
GLOVE BIOGEL PI IND STRL 7.0 (GLOVE) ×1 IMPLANT
GLOVE BIOGEL PI INDICATOR 7.0 (GLOVE) ×1
GLOVE SURG SYN 8.0 (GLOVE) ×4 IMPLANT
GOWN STRL REUS W/ TWL LRG LVL3 (GOWN DISPOSABLE) ×1 IMPLANT
GOWN STRL REUS W/TWL LRG LVL3 (GOWN DISPOSABLE) ×2
GOWN STRL REUS W/TWL XL LVL3 (GOWN DISPOSABLE) ×4 IMPLANT
IV LACTATED RINGERS 500ML (IV SOLUTION) IMPLANT
NEEDLE HYPO 22GX1.5 SAFETY (NEEDLE) IMPLANT
NEEDLE HYPO 25X1 1.5 SAFETY (NEEDLE) ×2 IMPLANT
NS IRRIG 1000ML POUR BTL (IV SOLUTION) ×2 IMPLANT
PACK BASIN DAY SURGERY FS (CUSTOM PROCEDURE TRAY) ×2 IMPLANT
PAD CAST 3X4 CTTN HI CHSV (CAST SUPPLIES) ×1 IMPLANT
PADDING CAST ABS 4INX4YD NS (CAST SUPPLIES)
PADDING CAST ABS COTTON 4X4 ST (CAST SUPPLIES) IMPLANT
PADDING CAST COTTON 3X4 STRL (CAST SUPPLIES) ×2
PADDING UNDERCAST 2 STRL (CAST SUPPLIES)
PADDING UNDERCAST 2X4 STRL (CAST SUPPLIES) IMPLANT
PASSER SUT SWANSON 36MM LOOP (INSTRUMENTS) IMPLANT
SHEET MEDIUM DRAPE 40X70 STRL (DRAPES) ×2 IMPLANT
SLEEVE SCD COMPRESS KNEE MED (MISCELLANEOUS) IMPLANT
SPEAR EYE SURG WECK-CEL (MISCELLANEOUS) IMPLANT
SPLINT PLASTER CAST XFAST 4X15 (CAST SUPPLIES) IMPLANT
SPLINT PLASTER XTRA FAST SET 4 (CAST SUPPLIES)
STOCKINETTE 4X48 STRL (DRAPES) ×2 IMPLANT
STRIP CLOSURE SKIN 1/2X4 (GAUZE/BANDAGES/DRESSINGS) IMPLANT
SUT ETHIBOND 3-0 V-5 (SUTURE) IMPLANT
SUT ETHILON 5 0 PS 2 18 (SUTURE) IMPLANT
SUT NYLON 9 0 VRM6 (SUTURE) IMPLANT
SUT PROLENE 3 0 PS 2 (SUTURE) IMPLANT
SUT PROLENE 6 0 P 1 18 (SUTURE) IMPLANT
SUT SILK 4 0 PS 2 (SUTURE) IMPLANT
SUT VICRYL RAPIDE 4-0 (SUTURE) IMPLANT
SUT VICRYL RAPIDE 4/0 PS 2 (SUTURE) IMPLANT
SYR BULB 3OZ (MISCELLANEOUS) ×2 IMPLANT
SYR CONTROL 10ML LL (SYRINGE) IMPLANT
TOWEL OR 17X24 6PK STRL BLUE (TOWEL DISPOSABLE) ×2 IMPLANT
TRAY DSU PREP LF (CUSTOM PROCEDURE TRAY) IMPLANT
TUBE FEEDING 5FR 15 INCH (TUBING) IMPLANT
UNDERPAD 30X30 (UNDERPADS AND DIAPERS) ×2 IMPLANT

## 2016-04-27 NOTE — Transfer of Care (Signed)
Immediate Anesthesia Transfer of Care Note  Patient: Mckenzie SitesCheryl S Molina  Procedure(s) Performed: Procedure(s): RIGHT MEDIAN NERVE EXPLORATION WITH NEUROLYSIS AND HYPOTHENAR FAT PAD FLAP  (Right)  Patient Location: PACU  Anesthesia Type:General  Level of Consciousness: awake and patient cooperative  Airway & Oxygen Therapy: Patient Spontanous Breathing and Patient connected to face mask oxygen  Post-op Assessment: Report given to RN and Post -op Vital signs reviewed and stable  Post vital signs: Reviewed and stable  Last Vitals:  Filed Vitals:   04/27/16 1056 04/27/16 1057  BP: 106/63   Pulse: 82 81  Temp:    Resp: 11 21    Last Pain: There were no vitals filed for this visit.       Complications: No apparent anesthesia complications

## 2016-04-27 NOTE — Op Note (Signed)
See note [phone]

## 2016-04-27 NOTE — Anesthesia Postprocedure Evaluation (Signed)
Anesthesia Post Note  Patient: Mckenzie SitesCheryl S Molina  Procedure(s) Performed: Procedure(s) (LRB): RIGHT MEDIAN NERVE EXPLORATION WITH NEUROLYSIS AND HYPOTHENAR FAT PAD FLAP  (Right)  Patient location during evaluation: PACU Anesthesia Type: General Level of consciousness: awake and alert Pain management: pain level controlled Vital Signs Assessment: post-procedure vital signs reviewed and stable Respiratory status: spontaneous breathing, nonlabored ventilation, respiratory function stable and patient connected to nasal cannula oxygen Cardiovascular status: blood pressure returned to baseline and stable Postop Assessment: no signs of nausea or vomiting Anesthetic complications: no    Last Vitals:  Filed Vitals:   04/27/16 1200 04/27/16 1242  BP: 134/77 137/79  Pulse: 83 85  Temp:  37 C  Resp: 21 18    Last Pain:  Filed Vitals:   04/27/16 1244  PainSc: 3                  Kennieth RadFitzgerald, Evelio Rueda E

## 2016-04-27 NOTE — Discharge Instructions (Signed)

## 2016-04-27 NOTE — H&P (Signed)
[name] is an 35 y.o. female.   Chief Complaint: right wrist recurrent CTS HPI: as above s/p median nerve release on right in past with worsening of symptoms and NCV  Past Medical History  Diagnosis Date  . Anxiety   . Depression   . GERD (gastroesophageal reflux disease)   . Sleep apnea   . Hypertension   . Anemia   . Diabetes mellitus without complication [facility])     Past Surgical History  Procedure Laterality Date  . Carpal tunnel    . Endometrial ablation      History reviewed. No pertinent family history. Social History:  reports that she has never smoked. She does not have any smokeless tobacco history on file. She reports that she drinks alcohol. She reports that she uses illicit drugs (Marijuana).  Allergies:  Allergies  Allergen Reactions  . Aspirin   . Ibuprofen     Medications Prior to Admission  Medication Sig Dispense Refill  . amLODipine (NORVASC) 10 MG tablet Take 10 mg by mouth daily.    . ferrous sulfate 325 (65 FE) MG tablet Take 325 mg by mouth 3 (three) times daily with meals.    Marland Kitchen losartan-hydrochlorothiazide (HYZAAR) 100-25 MG tablet Take 1 tablet by mouth daily.    . metFORMIN (GLUCOPHAGE) 500 MG tablet Take 500 mg by mouth 2 (two) times daily with a meal.    . pantoprazole (PROTONIX) 40 MG tablet Take 40 mg by mouth daily.    . rosuvastatin (CRESTOR) 20 MG tablet Take 20 mg by mouth daily.    . sertraline (ZOLOFT) 100 MG tablet Take 100 mg by mouth daily.    . sitaGLIPtin (JANUVIA) 100 MG tablet Take 100 mg by mouth daily.      Results for orders placed or performed during the hospital encounter of 04/27/16 (from the past 48 hour(s))  Basic metabolic panel     Status: Abnormal   Collection Time: 04/25/16  2:55 PM  Result Value Ref Range   Sodium 139 135 - 145 mmol/L   Potassium 2.9 (L) 3.5 - 5.1 mmol/L   Chloride 104 101 - 111 mmol/L   CO2 28 22 - 32 mmol/L   Glucose, Bld 134 (H) 65 - 99 mg/dL   BUN 15 6 - 20 mg/dL   Creatinine, Ser  0.78 0.44 - 1.00 mg/dL   Calcium 9.4 8.9 - 10.3 mg/dL   GFR calc non Af Amer >60 >60 mL/min   GFR calc Af Amer >60 >60 mL/min    Comment: (NOTE) The eGFR has been calculated using the CKD EPI equation. This calculation has not been validated in all clinical situations. eGFR's persistently <60 mL/min signify possible Chronic Kidney Disease.    Anion gap 7 5 - 15  CBC     Status: Abnormal   Collection Time: 04/25/16  2:55 PM  Result Value Ref Range   WBC 7.1 4.0 - 10.5 K/uL   RBC 4.90 3.87 - 5.11 MIL/uL   Hemoglobin 12.0 12.0 - 15.0 g/dL   HCT 37.6 36.0 - 46.0 %   MCV 76.7 (L) 78.0 - 100.0 fL   MCH 24.5 (L) 26.0 - 34.0 pg   MCHC 31.9 30.0 - 36.0 g/dL   RDW 16.0 (H) 11.5 - 15.5 %   Platelets 246 150 - 400 K/uL  Glucose, capillary     Status: Abnormal   Collection Time: 04/27/16  8:59 AM  Result Value Ref Range   Glucose-Capillary 133 (H) 65 - 99  mg/dL   No results found.  Review of Systems  All other systems reviewed and are negative.   Blood pressure 128/79, pulse 65, temperature 98.2 F (36.8 C), temperature source Oral, resp. rate 20, height 5' 4.5" (1.638 m), weight 95.165 kg (209 lb 12.8 oz), last menstrual period 03/30/2016, SpO2 100 %. Physical Exam  Constitutional: She is oriented to person, place, and time. She appears well-developed and well-nourished.  HENT:  Head: Normocephalic and atraumatic.  Neck: Normal range of motion.  Cardiovascular: Normal rate.   Respiratory: Effort normal.  Musculoskeletal:       Right wrist: She exhibits tenderness.  Recurrent right CTS with positive median nerve provacative tests and worsening NCV  Neurological: She is alert and oriented to person, place, and time.  Skin: Skin is warm.  Psychiatric: She has a normal mood and affect. Her behavior is normal. Judgment and thought content normal.     Assessment/Plan As above  Plan exploration of above with nerve wrap vs HTFF  Charlotte Crumb A, MD 04/27/2016, 9:44 AM

## 2016-04-27 NOTE — Anesthesia Preprocedure Evaluation (Addendum)
Anesthesia Evaluation  Patient identified by MRN, date of birth, ID band Patient awake    Reviewed: Allergy & Precautions, NPO status , Patient's Chart, lab work & pertinent test results  Airway Mallampati: II  TM Distance: >3 FB Neck ROM: Full    Dental  (+) Dental Advisory Given   Pulmonary sleep apnea ,    breath sounds clear to auscultation       Cardiovascular hypertension, Pt. on medications  Rhythm:Regular Rate:Normal     Neuro/Psych PSYCHIATRIC DISORDERS Anxiety Depression    GI/Hepatic GERD  Medicated,  Endo/Other  diabetes, Type 2, Oral Hypoglycemic Agents  Renal/GU      Musculoskeletal   Abdominal   Peds  Hematology  (+) anemia ,   Anesthesia Other Findings   Reproductive/Obstetrics                           Anesthesia Physical Anesthesia Plan  ASA: II  Anesthesia Plan: General   Post-op Pain Management:    Induction: Intravenous  Airway Management Planned: LMA  Additional Equipment:   Intra-op Plan:   Post-operative Plan: Extubation in OR  Informed Consent: I have reviewed the patients History and Physical, chart, labs and discussed the procedure including the risks, benefits and alternatives for the proposed anesthesia with the patient or authorized representative who has indicated his/her understanding and acceptance.   Dental advisory given  Plan Discussed with: CRNA  Anesthesia Plan Comments:         Anesthesia Quick Evaluation

## 2016-04-27 NOTE — Anesthesia Procedure Notes (Signed)
Procedure Name: LMA Insertion Date/Time: 04/27/2016 9:53 AM Performed by: Estel Tonelli D Pre-anesthesia Checklist: Patient identified, Emergency Drugs available, Suction available and Patient being monitored Patient Re-evaluated:Patient Re-evaluated prior to inductionOxygen Delivery Method: Circle system utilized Preoxygenation: Pre-oxygenation with 100% oxygen Intubation Type: IV induction Ventilation: Mask ventilation without difficulty LMA: LMA inserted LMA Size: 4.0 Number of attempts: 1 Airway Equipment and Method: Bite block Placement Confirmation: positive ETCO2 Tube secured with: Tape Dental Injury: Teeth and Oropharynx as per pre-operative assessment

## 2016-04-28 ENCOUNTER — Encounter (HOSPITAL_BASED_OUTPATIENT_CLINIC_OR_DEPARTMENT_OTHER): Payer: Self-pay | Admitting: Orthopedic Surgery

## 2016-04-28 NOTE — Op Note (Signed)
NAMAndrena Mews:  Molina, Mckenzie              ACCOUNT NO.:  [id number]  MEDICAL RECORD NO.:  [id number]  LOCATION:                                 FACILITY:  PHYSICIAN:  Artist PaisMatthew A. Shaunessy Dobratz, M.D.DATE OF BIRTH:  [birth year]  DATE OF PROCEDURE:  04/27/2016 DATE OF DISCHARGE:                              OPERATIVE REPORT   PREOPERATIVE DIAGNOSIS:  Recurrent right carpal tunnel syndrome.  POSTOPERATIVE DIAGNOSIS:  Recurrent right carpal tunnel syndrome.  PROCEDURE:  Exploration of median nerve, right wrist, right hand with external neurolysis under loupe magnification, hypothenar fat pad flap, and tenotomy of palmaris longus tendon.  SURGEON:  Artist PaisMatthew A. Mina MarbleWeingold, M.D.  ASSISTANT:  None.  ANESTHESIA:  General.  COMPLICATIONS:  None.  DRAINS:  No drains.  SPECIMEN:  One specimen sent.  DESCRIPTION OF PROCEDURE:  The patient was taken the operating suite. After induction of adequate general anesthetic.  Right upper extremity was prepped and draped in sterile fashion.  An Esmarch was used to exsanguinate the limb.  Tourniquet was inflated to 250 mmHg.  At this point in time, an incision was made over the palmaris longus tendon proximal to wrist crease for 3-4 cm.  It was [name] across the wrist and ulnar fashion and then paralleling the thenar crease incorporating her old incision.  Skin was incised.  Flaps were raised.  We identified the median nerve proximally in the area of the fascia.  We dissected normal nerve under the palmaris longus tendon.  There was significant constriction of the palmaris longus tendon.  We denote it as insertion of the palmar fascia that retract.  We then identified a large hourglass constriction of the median nerve proximal to the carpal tunnel.  We carefully then release the scarred median nerve off the radial leaflet. Under loupe magnification, we did an external neurolysis.  There was significant scarring about the median nerve, and there was  significant constriction of the nerve in the area of the carpal canal.  Just proximal to it again lesions were identified and photographed.  We took a sample of the scarred epineurium was sent for pathologic confirmation. We then harvested a hypothenar fat pad flap off the ulnar side and transposed across the nerve and sutured it to the radial side using 4-0 Vicryl.  We had adequate coverage of the nerve at the level of the wrist crease.  The wound was then thoroughly irrigated.  We then loosely closed the skin with 4-0 nylon interrupted horizontal mattress sutures.  Xeroform 4x4s, fluffs, and a volar splint was applied.  The patient tolerated the procedure well and went to recovery room in stable fashion.     Artist PaisMatthew A. Mina MarbleWeingold, M.D.   ______________________________ Artist PaisMatthew A. Mina MarbleWeingold, M.D.    MAW/MEDQ  D:  04/27/2016  T:  04/28/2016  Job:  [id number]
